# Patient Record
Sex: Male | Born: 1965 | Race: White | Hispanic: No | Marital: Married | Smoking: Never smoker
Health system: Southern US, Community
[De-identification: ages and names within clinical notes are randomized; demographics above are authoritative.]

## PROBLEM LIST (undated history)

## (undated) DIAGNOSIS — B009 Herpesviral infection, unspecified: Secondary | ICD-10-CM

## (undated) DIAGNOSIS — Z8619 Personal history of other infectious and parasitic diseases: Secondary | ICD-10-CM

## (undated) DIAGNOSIS — D649 Anemia, unspecified: Secondary | ICD-10-CM

## (undated) HISTORY — DX: Herpesviral infection, unspecified: B00.9

## (undated) HISTORY — DX: Personal history of other infectious and parasitic diseases: Z86.19

---

## 2004-02-07 DIAGNOSIS — G4700 Insomnia, unspecified: Secondary | ICD-10-CM | POA: Insufficient documentation

## 2004-06-06 DIAGNOSIS — B009 Herpesviral infection, unspecified: Secondary | ICD-10-CM

## 2004-06-06 HISTORY — DX: Herpesviral infection, unspecified: B00.9

## 2014-09-29 ENCOUNTER — Other Ambulatory Visit: Payer: Self-pay

## 2014-09-29 DIAGNOSIS — G4700 Insomnia, unspecified: Secondary | ICD-10-CM

## 2014-09-29 MED ORDER — CLONAZEPAM 0.5 MG PO TABS: ORAL_TABLET | ORAL | Status: DC

## 2014-09-29 NOTE — Telephone Encounter (Signed)
OK to call in #60 rf x 0. He is due for follow up o.v. Within the next month or CPE.

## 2014-09-29 NOTE — Telephone Encounter (Signed)
Patient advised as directed below. [facility] Patient states he will call back to schedule a follow up appointment or CPE next week.

## 2014-09-29 NOTE — Telephone Encounter (Signed)
Patient sent email requesting a refill. Last refill was 04/02/2014, qty: 60 with 4 additional refills. Last office visit was 10/09/2013

## 2014-11-02 ENCOUNTER — Ambulatory Visit (INDEPENDENT_AMBULATORY_CARE_PROVIDER_SITE_OTHER): Payer: 59 | Admitting: Family Medicine

## 2014-11-02 ENCOUNTER — Encounter: Payer: Self-pay | Admitting: Family Medicine

## 2014-11-02 VITALS — BP 122/78 | HR 76 | Temp 98.2°F | Resp 16 | Ht 71.0 in | Wt 233.0 lb

## 2014-11-02 DIAGNOSIS — Z23 Encounter for immunization: Secondary | ICD-10-CM

## 2014-11-02 DIAGNOSIS — Z0000 Encounter for general adult medical examination without abnormal findings: Secondary | ICD-10-CM | POA: Diagnosis not present

## 2014-11-02 DIAGNOSIS — Z125 Encounter for screening for malignant neoplasm of prostate: Secondary | ICD-10-CM | POA: Diagnosis not present

## 2014-11-02 DIAGNOSIS — Z1211 Encounter for screening for malignant neoplasm of colon: Secondary | ICD-10-CM | POA: Diagnosis not present

## 2014-11-02 DIAGNOSIS — M7651 Patellar tendinitis, right knee: Secondary | ICD-10-CM | POA: Insufficient documentation

## 2014-11-02 DIAGNOSIS — F419 Anxiety disorder, unspecified: Secondary | ICD-10-CM | POA: Insufficient documentation

## 2014-11-02 DIAGNOSIS — G4700 Insomnia, unspecified: Secondary | ICD-10-CM | POA: Diagnosis not present

## 2014-11-02 DIAGNOSIS — Z6832 Body mass index (BMI) 32.0-32.9, adult: Secondary | ICD-10-CM | POA: Diagnosis not present

## 2014-11-02 LAB — IFOBT (OCCULT BLOOD): IFOBT: NEGATIVE

## 2014-11-02 MED ORDER — CLONAZEPAM 0.5 MG PO TABS: ORAL_TABLET | ORAL | Status: DC

## 2014-11-02 NOTE — Patient Instructions (Addendum)
   Please contact your eyecare professional to schedule a routine eye exam  

## 2014-11-02 NOTE — Progress Notes (Signed)
Patient: Andrew Clay, Male    DOB: 22-May-1965, 49 y.o.   MRN: [id number] Visit Date: 11/02/2014  Today's Provider: Mila Merry, MD   Chief Complaint  Patient presents with  . Annual Exam  . Insomnia    follow up   Subjective:    Annual physical exam Andrew Clay is a 49 y.o. male who presents today for health maintenance and complete physical. He feels fairly well. He reports exercising doing weight training every day. He reports he is sleeping poorly.  -----------------------------------------------------------------  Insomnia  He presents today for evaluation of insomnia. Onset was 1 years ago. Insomnia is getting worse due to lifestyle changes. Patient states his kids have gone back to school and his wife has to get up at 5am every morning.   He has trouble sleeping once or twice a week.   He does not have difficulty FALLING asleep. He has difficulty STAYING asleep. He does not wake frequently to urinate. He does not have urge to move legs when resting. He is not having pain when trying to sleep  He is having anxiety. Patient states the stress has been tolerable.  He is having a lot of stress in his life. Marland Kitchen He is not having depression.  He is not taking stimulant medications.  He is not taking new medications:  He is not taking OTC sleeping aid. Marland Kitchen He is taking medications to help sleep. Takes Clonazepam as needed at bedtime.  He is not drinking alcohol to help sleep. He is not using illicit drugs.  He states that clonazepam continues to work well to help him get back to sleep when he wakes up at night.   --------------------------------------------------------------------    Review of Systems  Constitutional: Negative for fever, chills, appetite change and fatigue.  HENT: Negative for congestion, ear pain, hearing loss, nosebleeds and trouble swallowing.   Eyes: Negative for pain and visual disturbance.  Respiratory: Negative for cough, chest  tightness and shortness of breath.   Cardiovascular: Negative for chest pain, palpitations and leg swelling.  Gastrointestinal: Negative for nausea, vomiting, abdominal pain, diarrhea, constipation and blood in stool.  Endocrine: Negative for polydipsia, polyphagia and polyuria.  Genitourinary: Positive for dysuria. Negative for flank pain.  Musculoskeletal: Negative for myalgias, back pain, joint swelling, arthralgias and neck stiffness.  Skin: Negative for color change, rash and wound.  Neurological: Negative for dizziness, tremors, seizures, speech difficulty, weakness, light-headedness and headaches.  Psychiatric/Behavioral: Negative for behavioral problems, confusion, sleep disturbance, dysphoric mood and decreased concentration. The patient is not nervous/anxious.   All other systems reviewed and are negative.   Social History He  reports that he has never smoked. He does not have any smokeless tobacco history on file. He reports that he drinks alcohol. He reports that he does not use illicit drugs. Social History   Social History  . Marital Status: Married    Spouse Name: N/A  . Number of Children: 5  . Years of Education: N/A   Occupational History  . Architectural technologist    Social History Main Topics  . Smoking status: Never Smoker   . Smokeless tobacco: None  . Alcohol Use: 0.0 oz/week    0 Standard drinks or equivalent per week     Comment: drinks 2 glasses of wine a night  . Drug Use: No  . Sexual Activity: Not Asked   Other Topics Concern  . None   Social History Narrative    Patient Active  Problem List   Diagnosis Date Noted  . Anxiety 11/02/2014  . Patellar tendinitis of right knee 11/02/2014  . Uncomplicated herpes simplex 06/06/2004  . Insomnia 02/07/2004    History reviewed. No pertinent past surgical history.  Family History  Family Status  Relation Status Death Age  . Mother Alive   . Father Alive   . Maternal Grandmother Deceased     old age     His family history includes Diabetes in his father; Stroke in his mother. He was adopted.    No Known Allergies  Previous Medications   CLONAZEPAM (KLONOPIN) 0.5 MG TABLET    Up to 2 tablets at bedtime, as needed    Patient Care Team: Malva Limes, MD as PCP - General (Family Medicine)     Objective:   Vitals: BP 122/78 mmHg  Pulse 76  Temp(Src) 98.2 F (36.8 C) (Oral)  Resp 16  Ht  (1.803 m)  Wt 233 lb (105.688 kg)  BMI 32.51 kg/m2   Physical Exam   General Appearance:    Alert, cooperative, no distress, appears stated age  Head:    Normocephalic, without obvious abnormality, atraumatic  Eyes:    PERRL, conjunctiva/corneas clear, EOM's intact, fundi    benign, both eyes       Ears:    Normal TM's and external ear canals, both ears  Nose:   Nares normal, septum midline, mucosa normal, no drainage   or sinus tenderness  Throat:   Lips, mucosa, and tongue normal; teeth and gums normal  Neck:   Supple, symmetrical, trachea midline, no adenopathy;       thyroid:  No enlargement/tenderness/nodules; no carotid   bruit or JVD  Back:     Symmetric, no curvature, ROM normal, no CVA tenderness  Lungs:     Clear to auscultation bilaterally, respirations unlabored  Chest wall:    No tenderness or deformity  Heart:    Regular rate and rhythm, S1 and S2 normal, no murmur, rub   or gallop  Abdomen:     Soft, non-tender, bowel sounds active all four quadrants,    no masses, no organomegaly  Genitalia:    deferred  Rectal:    normal tone, normal prostate, no masses or tenderness and the prostate is enlarged at the bilateral, with an approx volume of 20 gms, negative bulge  Extremities:   Extremities normal, atraumatic, no cyanosis or edema  Pulses:   2+ and symmetric all extremities  Skin:   Skin color, texture, turgor normal, no rashes or lesions  Lymph nodes:   Cervical, supraclavicular, and axillary nodes normal  Neurologic:   CNII-XII intact. Normal strength,  sensation and reflexes      throughout    Depression Screen PHQ 2/9 Scores 11/02/2014  PHQ - 2 Score 0  PHQ- 9 Score 4    Results for orders placed or performed in visit on 11/02/14  IFOBT POC (occult bld, rslt in office)  Result Value Ref Range   IFOBT Negative    EKG: NSR  Assessment & Plan:     Routine Health Maintenance and Physical Exam  Exercise Activities and Dietary recommendations Goals    None       There is no immunization history on file for this patient.  Health Maintenance  Topic Date Due  . Hepatitis C Screening  06-29-1965  . HIV Screening  12/18/1978  . TETANUS/TDAP  12/18/1982  . COLONOSCOPY  12/17/2013  . INFLUENZA VACCINE  09/07/2014  Discussed health benefits of physical activity, and encouraged him to engage in regular exercise appropriate for his age and condition.    --------------------------------------------------------------------  1. Annual physical exam Doing well. Advised he needs routine eye exam - EKG 12-Lead - Lipid panel - Basic metabolic panel - PSA  2. Insomnia Doing well with clonazepam hs.  - clonazePAM (KLONOPIN) 0.5 MG tablet; Up to 2 tablets at bedtime, as needed  Dispense: 60 tablet; Refill: 4  3. BMI 32.0-32.9,adult D&E  4. Colon cancer screening  - Ambulatory referral to Gastroenterology - IFOBT POC (occult bld, rslt in office)  5. Prostate cancer screening   6. Need for Tdap vaccination  - Tdap vaccine greater than or equal to 7yo IM  7. Need for influenza vaccination  - Flu Vaccine QUAD 36+ mos IM

## 2014-11-03 ENCOUNTER — Telehealth: Payer: Self-pay | Admitting: *Deleted

## 2014-11-03 NOTE — Telephone Encounter (Signed)
error 

## 2014-11-10 LAB — PSA: Prostate Specific Ag, Serum: 0.8 ng/mL (ref 0.0–4.0)

## 2014-11-10 LAB — BASIC METABOLIC PANEL
BUN/Creatinine Ratio: 16 (ref 9–20)
BUN: 15 mg/dL (ref 6–24)
CHLORIDE: 105 mmol/L (ref 97–108)
CO2: 26 mmol/L (ref 18–29)
Calcium: 9.4 mg/dL (ref 8.7–10.2)
Creatinine, Ser: 0.91 mg/dL (ref 0.76–1.27)
GFR, EST AFRICAN AMERICAN: 113 mL/min/{1.73_m2} (ref >59)
GFR, EST NON AFRICAN AMERICAN: 98 mL/min/{1.73_m2} (ref >59)
Glucose: 98 mg/dL (ref 65–99)
POTASSIUM: 4.7 mmol/L (ref 3.5–5.2)
SODIUM: 143 mmol/L (ref 134–144)

## 2014-11-10 LAB — LIPID PANEL
CHOL/HDL RATIO: 4.1 ratio (ref 0.0–5.0)
CHOLESTEROL TOTAL: 135 mg/dL (ref 100–199)
HDL: 33 mg/dL — ABNORMAL LOW (ref >39)
LDL CALC: 88 mg/dL (ref 0–99)
Triglycerides: 70 mg/dL (ref 0–149)
VLDL CHOLESTEROL CAL: 14 mg/dL (ref 5–40)

## 2015-04-23 ENCOUNTER — Other Ambulatory Visit: Payer: Self-pay | Admitting: *Deleted

## 2015-04-23 DIAGNOSIS — G4700 Insomnia, unspecified: Secondary | ICD-10-CM

## 2015-04-23 MED ORDER — CLONAZEPAM 0.5 MG PO TABS: ORAL_TABLET | ORAL | Status: DC

## 2015-04-23 NOTE — Telephone Encounter (Signed)
Rx called in to pharmacy. 

## 2015-04-23 NOTE — Telephone Encounter (Signed)
Please call in clonazepam  

## 2015-09-16 ENCOUNTER — Other Ambulatory Visit: Payer: Self-pay | Admitting: Family Medicine

## 2015-09-16 DIAGNOSIS — G4700 Insomnia, unspecified: Secondary | ICD-10-CM

## 2015-09-16 NOTE — Telephone Encounter (Signed)
Please call in clonazepam  

## 2015-09-16 NOTE — Telephone Encounter (Signed)
Rx called in to pharmacy. 

## 2015-10-19 ENCOUNTER — Other Ambulatory Visit: Payer: Self-pay | Admitting: Family Medicine

## 2015-10-19 DIAGNOSIS — G4700 Insomnia, unspecified: Secondary | ICD-10-CM

## 2015-10-19 NOTE — Telephone Encounter (Signed)
Please call in clonazepam  Patient has not been seen for a year. Needs follow o.v. Or CPE in the next month.

## 2015-10-19 NOTE — Telephone Encounter (Signed)
Patient stated that he will call back and schedule an appt.

## 2015-10-19 NOTE — Telephone Encounter (Signed)
Rx called in to pharmacy. 

## 2015-11-22 ENCOUNTER — Other Ambulatory Visit: Payer: Self-pay | Admitting: Family Medicine

## 2015-11-22 MED ORDER — CLONAZEPAM 0.5 MG PO TABS: ORAL_TABLET | ORAL | 3 refills | Status: DC

## 2015-11-22 NOTE — Telephone Encounter (Signed)
Rx called in to pharmacy. 

## 2015-11-22 NOTE — Telephone Encounter (Signed)
Please call in clonazepam  

## 2015-11-22 NOTE — Telephone Encounter (Signed)
Pt contacted office for refill request on the following medications:  clonazePAM (KLONOPIN) 0.5 MG tablet.  [facility] [address]  [place]  ZO#[phone]/WJCB#[phone]/MW  Pt had an appointment scheduled for 12/06/2015.  Pt states he will be out of medication today.

## 2015-12-06 ENCOUNTER — Ambulatory Visit
Admission: RE | Admit: 2015-12-06 | Discharge: 2015-12-06 | Disposition: A | Discharge status: Home / Self Care | Payer: 59 | Source: Ambulatory Visit | Attending: Family Medicine | Admitting: Family Medicine

## 2015-12-06 ENCOUNTER — Encounter: Payer: Self-pay | Admitting: Family Medicine

## 2015-12-06 ENCOUNTER — Ambulatory Visit (INDEPENDENT_AMBULATORY_CARE_PROVIDER_SITE_OTHER): Payer: 59 | Admitting: Family Medicine

## 2015-12-06 VITALS — BP 130/90 | HR 68 | Temp 98.6°F | Resp 16 | Wt 234.0 lb

## 2015-12-06 DIAGNOSIS — M25562 Pain in left knee: Principal | ICD-10-CM

## 2015-12-06 DIAGNOSIS — M25561 Pain in right knee: Secondary | ICD-10-CM

## 2015-12-06 DIAGNOSIS — M25762 Osteophyte, left knee: Secondary | ICD-10-CM | POA: Insufficient documentation

## 2015-12-06 DIAGNOSIS — F5101 Primary insomnia: Secondary | ICD-10-CM | POA: Diagnosis not present

## 2015-12-06 DIAGNOSIS — R21 Rash and other nonspecific skin eruption: Secondary | ICD-10-CM

## 2015-12-06 NOTE — Progress Notes (Signed)
Patient: Andrew Clay Male    DOB: Jun 01, 1965   50 y.o.   MRN: [id number] Visit Date: 12/06/2015  Today's Provider: Lelon Huh, MD   Chief Complaint  Patient presents with  . Knee Pain    x several months   Subjective:    Knee Pain   Incident onset: several months ago. There was no injury mechanism. The pain is present in the left knee and right knee. The quality of the pain is described as aching. The pain has been intermittent since onset. Exacerbated by: running or bending down. He has tried ice (stretching) for the symptoms. The treatment provided no relief.  He states pain starting after being bit be medium sized tick about 3 months ago, and was followed by red rash down left lateral hip and leg. Rash eventually resolved, but pain in knee have persistent and worsened. Has had no fever or generalized rash, no other joint pains. Pain is present every day as soon as he gets out of bed, and gradually worsens throughout the day.   He is also here for follow up of clonazepam which he takes periodically to help rest at night, especially when under more stress than usual. States it continues to work well with no after effects day after he takes it.     No Known Allergies   Current Outpatient Prescriptions:  .  clonazePAM (KLONOPIN) 0.5 MG tablet, TAKE UP TO 2 TABLETS BY MOUTH AT BEDTIME AS NEEDED, Disp: 60 tablet, Rfl: 3  Review of Systems  Constitutional: Negative for appetite change, chills and fever.  Respiratory: Negative for chest tightness, shortness of breath and wheezing.   Cardiovascular: Negative for chest pain and palpitations.  Gastrointestinal: Negative for abdominal pain, nausea and vomiting.  Musculoskeletal: Positive for arthralgias (both knees).    Social History  Substance Use Topics  . Smoking status: Never Smoker  . Smokeless tobacco: Never Used  . Alcohol use 0.0 oz/week     Comment: drinks 2 glasses of wine a night   Objective:   BP 130/90 (BP  Location: Left Arm, Patient Position: Sitting, Cuff Size: Large)   Pulse 68   Temp 98.6 F (37 C) (Oral)   Resp 16   Wt 234 lb (106.1 kg)   SpO2 97% Comment: room air  BMI 32.64 kg/m   Physical Exam  General appearance: alert, well developed, well nourished, cooperative and in no distress Head: Normocephalic, without obvious abnormality, atraumatic Respiratory: Respirations even and unlabored, normal respiratory rate Extremities: Tender around joint lines of both knees R>L. Minimal swelling. No erythema. FROM of both knees. No other joint abnormalities      Assessment & Plan:     1. Pain in both knees, unspecified chronicity  - DG Knee Complete 4 Views Right; Future - DG Knee Complete 4 Views Left; Future  2. Primary insomnia Doing well with prn clonazepam which was refilled today.   3. Arthralgia of both knees - B. Burgdorfi Antibodies - Sed Rate (ESR) - CBC with Differential/Platelet  4. Rash No resolved, but he feels it may have been related to tick bite and persistent join pains.  - B. Burgdorfi Antibodies - Sed Rate (ESR) - CBC with Differential/Platelet       Lelon Huh, MD  [facility]

## 2015-12-07 LAB — CBC WITH DIFFERENTIAL/PLATELET
BASOS ABS: 0 10*3/uL (ref 0.0–0.2)
Basos: 0 %
EOS (ABSOLUTE): 0.1 10*3/uL (ref 0.0–0.4)
Eos: 1 %
Hematocrit: 43.9 % (ref 37.5–51.0)
Hemoglobin: 15.1 g/dL (ref 12.6–17.7)
IMMATURE GRANS (ABS): 0 10*3/uL (ref 0.0–0.1)
IMMATURE GRANULOCYTES: 0 %
LYMPHS: 32 %
Lymphocytes Absolute: 2.2 10*3/uL (ref 0.7–3.1)
MCH: 30.8 pg (ref 26.6–33.0)
MCHC: 34.4 g/dL (ref 31.5–35.7)
MCV: 89 fL (ref 79–97)
Monocytes Absolute: 0.6 10*3/uL (ref 0.1–0.9)
Monocytes: 8 %
NEUTROS PCT: 59 %
Neutrophils Absolute: 4 10*3/uL (ref 1.4–7.0)
PLATELETS: 226 10*3/uL (ref 150–379)
RBC: 4.91 x10E6/uL (ref 4.14–5.80)
RDW: 13.3 % (ref 12.3–15.4)
WBC: 6.9 10*3/uL (ref 3.4–10.8)

## 2015-12-07 LAB — B. BURGDORFI ANTIBODIES: Lyme IgG/IgM Ab: <0.91 {ISR} (ref 0.00–0.90)

## 2015-12-07 LAB — SEDIMENTATION RATE: SED RATE: 4 mm/h (ref 0–30)

## 2015-12-09 ENCOUNTER — Telehealth: Payer: Self-pay

## 2015-12-09 MED ORDER — NAPROXEN 500 MG PO TABS: 500.0000 mg | ORAL_TABLET | Freq: Two times a day (BID) | ORAL | 1 refills | Status: DC | PRN

## 2015-12-09 NOTE — Telephone Encounter (Signed)
-----   Message from Malva Limesonald E Fisher, MD sent at 12/08/2015  9:55 AM EDT ----- Labs are completely normal. No sign of any tick born infections. Xrays of knees show moderate arthritis with some spurs in knee join. Recommend try of naprosyn 500mg  Bid prn, #60 rf x 1. If no improvement with naprosyn then may want to see orthopedist for cortisone injections.

## 2015-12-09 NOTE — Telephone Encounter (Signed)
Patient was advised of report, [facility] [place] see order. KW

## 2015-12-09 NOTE — Telephone Encounter (Signed)
[id number] 

## 2016-03-03 ENCOUNTER — Other Ambulatory Visit: Payer: Self-pay | Admitting: *Deleted

## 2016-03-03 MED ORDER — CLONAZEPAM 0.5 MG PO TABS: ORAL_TABLET | ORAL | 1 refills | Status: DC

## 2016-03-03 NOTE — Telephone Encounter (Signed)
[facility] for [facility] substance it needs to be faxed in or mailed in-aa

## 2016-03-03 NOTE — Telephone Encounter (Signed)
Please call in clonazepam  

## 2016-03-08 MED ORDER — CLONAZEPAM 0.5 MG PO TABS: ORAL_TABLET | ORAL | 1 refills | Status: DC

## 2016-05-24 ENCOUNTER — Other Ambulatory Visit: Payer: Self-pay | Admitting: Family Medicine

## 2016-05-24 MED ORDER — CLONAZEPAM 0.5 MG PO TABS: ORAL_TABLET | ORAL | 1 refills | Status: DC

## 2016-05-24 NOTE — Telephone Encounter (Signed)
Please call in clonazepam  

## 2016-05-24 NOTE — Telephone Encounter (Signed)
Pt needs a 7 day supply of   clonazePAM (KLONOPIN) 0.5 MG tablet [facility] [address] [facility] [place]  [facility] is sending his rx but it will get to him late after he would run out.  Pt's call back is [phone]  Thanks Barth Kirks

## 2016-05-25 MED ORDER — CLONAZEPAM 0.5 MG PO TABS: ORAL_TABLET | ORAL | 1 refills | Status: DC

## 2016-05-25 NOTE — Telephone Encounter (Signed)
Rx faxed to pharmacy  

## 2016-11-27 DIAGNOSIS — Z23 Encounter for immunization: Secondary | ICD-10-CM | POA: Diagnosis not present

## 2016-12-20 ENCOUNTER — Other Ambulatory Visit: Payer: Self-pay | Admitting: Family Medicine

## 2016-12-20 NOTE — Telephone Encounter (Signed)
Last OV 12/06/15 and last RF 05/25/16

## 2016-12-20 NOTE — Telephone Encounter (Signed)
Patient has not been seen in over a year, needs to schedule o.v. Before refill can be approved.  

## 2016-12-20 NOTE — Telephone Encounter (Signed)
[facility] pharmacy faxed a request for a 90-days supply for the following medication. Thanks CC  clonazePAM (KLONOPIN) 0.5 MG tablet

## 2016-12-21 MED ORDER — CLONAZEPAM 0.5 MG PO TABS: ORAL_TABLET | ORAL | 0 refills | Status: DC

## 2016-12-21 NOTE — Telephone Encounter (Signed)
Patient states that he is going on vacation next week and wanted to know if he can have enough clonazepam to get by on until he can come in for ov. Pt would not schedule appt.

## 2016-12-21 NOTE — Telephone Encounter (Signed)
Please call in clonazepam  

## 2016-12-22 MED ORDER — CLONAZEPAM 0.5 MG PO TABS: ORAL_TABLET | ORAL | 0 refills | Status: DC

## 2016-12-22 NOTE — Telephone Encounter (Signed)
Spoke with [facility] pharmacist and they can not take a verbal call in on controlled medications per regulations. RX faxed to them [place]-Anastasiya V Hopkins, RMA

## 2016-12-22 NOTE — Telephone Encounter (Signed)
[facility] but is going to contact [facility] to find out if they can do a 30 day supply or if it has to go to local pharmacy. Please advise. Thanks TNP

## 2016-12-22 NOTE — Telephone Encounter (Signed)
Pt called after speaking with [facility]. Pt stated to send to [facility] they will fill a 30 day supply. Thanks TNP

## 2016-12-22 NOTE — Telephone Encounter (Signed)
Patient will call us back and let us know per Rosey Batheresa

## 2016-12-22 NOTE — Telephone Encounter (Signed)
Lmtcb, want to verify what pharmacy he wants medication called in to since its only 30 tablets-Andrew Clay, RMA

## 2017-01-08 ENCOUNTER — Ambulatory Visit (INDEPENDENT_AMBULATORY_CARE_PROVIDER_SITE_OTHER): Payer: 59 | Admitting: Family Medicine

## 2017-01-08 ENCOUNTER — Encounter: Payer: Self-pay | Admitting: Family Medicine

## 2017-01-08 ENCOUNTER — Telehealth: Payer: Self-pay | Admitting: Family Medicine

## 2017-01-08 VITALS — BP 120/80 | HR 69 | Temp 98.0°F | Resp 16 | Wt 236.0 lb

## 2017-01-08 DIAGNOSIS — Z1211 Encounter for screening for malignant neoplasm of colon: Secondary | ICD-10-CM | POA: Diagnosis not present

## 2017-01-08 DIAGNOSIS — F5101 Primary insomnia: Secondary | ICD-10-CM

## 2017-01-08 MED ORDER — CLONAZEPAM 0.5 MG PO TABS: ORAL_TABLET | ORAL | 1 refills | Status: DC

## 2017-01-08 NOTE — Progress Notes (Signed)
       Patient: Andrew Clay Male    DOB: December 09, 1965   51 y.o.   MRN: [id number] Visit Date: 01/08/2017  Today's Provider: Mila Merryonald Fisher, MD   Chief Complaint  Patient presents with  . Follow-up  . Insomnia   Subjective:    HPI  Insomnia From 12/06/2015-He has reduced clonazepam to 1 x 0.5mg  hs which is effective, but he would like to wean off off medication. He has a very hard time sleeping if he runs out. Has tried melatonin in the past. He does drink one glass of wine most nights. No other problems with mood or anxiety.   Colon cancer screening Has been doing iFOBT in the past. No family history of colon cancer or polyps.   No Known Allergies   Current Outpatient Medications:  .  clonazePAM (KLONOPIN) 0.5 MG tablet, TAKE UP TO 2 TABLETS BY MOUTH AT BEDTIME AS NEEDED, Disp: 30 tablet, Rfl: 0 .  naproxen (NAPROSYN) 500 MG tablet, Take 1 tablet (500 mg total) by mouth 2 (two) times daily as needed for moderate pain. (Patient not taking: Reported on 01/08/2017), Disp: 60 tablet, Rfl: 1  Review of Systems  Constitutional: Negative for appetite change, chills and fever.  Respiratory: Negative for chest tightness, shortness of breath and wheezing.   Cardiovascular: Negative for chest pain and palpitations.  Gastrointestinal: Negative for abdominal pain, nausea and vomiting.    Social History   Tobacco Use  . Smoking status: Never Smoker  . Smokeless tobacco: Never Used  Substance Use Topics  . Alcohol use: Yes    Alcohol/week: 0.0 oz    Comment: drinks 2 glasses of wine a night   Objective:   BP 120/80 (BP Location: Right Arm, Patient Position: Sitting, Cuff Size: Large)   Pulse 69   Temp 98 F (36.7 C) (Oral)   Resp 16   Wt 236 lb (107 kg)   SpO2 98%   BMI 32.92 kg/m  Vitals:   01/08/17 0902  BP: 120/80  Pulse: 69  Resp: 16  Temp: 98 F (36.7 C)  TempSrc: Oral  SpO2: 98%  Weight: 236 lb (107 kg)     Physical Exam  General appearance: alert, well  developed, well nourished, cooperative and in no distress Head: Normocephalic, without obvious abnormality, atraumatic Respiratory: Respirations even and unlabored, normal respiratory rate Extremities: No gross deformities Skin: Skin color, texture, turgor normal. No rashes seen  Psych: Appropriate mood and affect. Neurologic: Mental status: Alert, oriented to person, place, and time, thought content appropriate.     Assessment & Plan:     1. Primary insomnia Discussed good sleep hygiene including abstaining from alcohol and stimulants. Reduced clonazepam to 1/2 tablet hs as tolerated, try QOD if doing well after 1-2 months.   2. Colon cancer screening  - Cologuard       Mila Merryonald Fisher, MD  [facility]

## 2017-01-08 NOTE — Telephone Encounter (Signed)
Order for cologuard faxed to [facility] °

## 2017-01-08 NOTE — Patient Instructions (Signed)

## 2017-11-05 ENCOUNTER — Other Ambulatory Visit: Payer: Self-pay | Admitting: Family Medicine

## 2017-11-05 MED ORDER — CLONAZEPAM 0.5 MG PO TABS: ORAL_TABLET | ORAL | 1 refills | Status: DC

## 2017-11-05 NOTE — Telephone Encounter (Signed)
Pt needing a refill on: clonazePAM (KLONOPIN) 0.5 MG tablet  Please fill at: [facility] [place], [place] - [address]  Thanks, TGH

## 2017-11-21 DIAGNOSIS — Z23 Encounter for immunization: Secondary | ICD-10-CM | POA: Diagnosis not present

## 2018-05-21 ENCOUNTER — Other Ambulatory Visit: Payer: Self-pay | Admitting: Family Medicine

## 2018-05-21 MED ORDER — CLONAZEPAM 0.5 MG PO TABS: ORAL_TABLET | ORAL | 2 refills | Status: DC

## 2018-05-21 NOTE — Telephone Encounter (Signed)
Pt needs refill on   Clonazepam 0.5 mg  [facility] [address]  Thanks teri

## 2018-10-02 IMAGING — CR DG KNEE COMPLETE 4+V*L*
1 series · 4 of 4 positions shown · non-contrast
Comparison: None in PACs

CLINICAL DATA: Bilateral knee pain greatest on the right without
discrete injury

EXAM:
RIGHT KNEE - COMPLETE 4+ VIEW; LEFT KNEE - COMPLETE 4+ VIEW

[Series 1: dg knee complete 4 views left · 0.14mm/px · 4 of 4 slices shown]
[im 1/4]
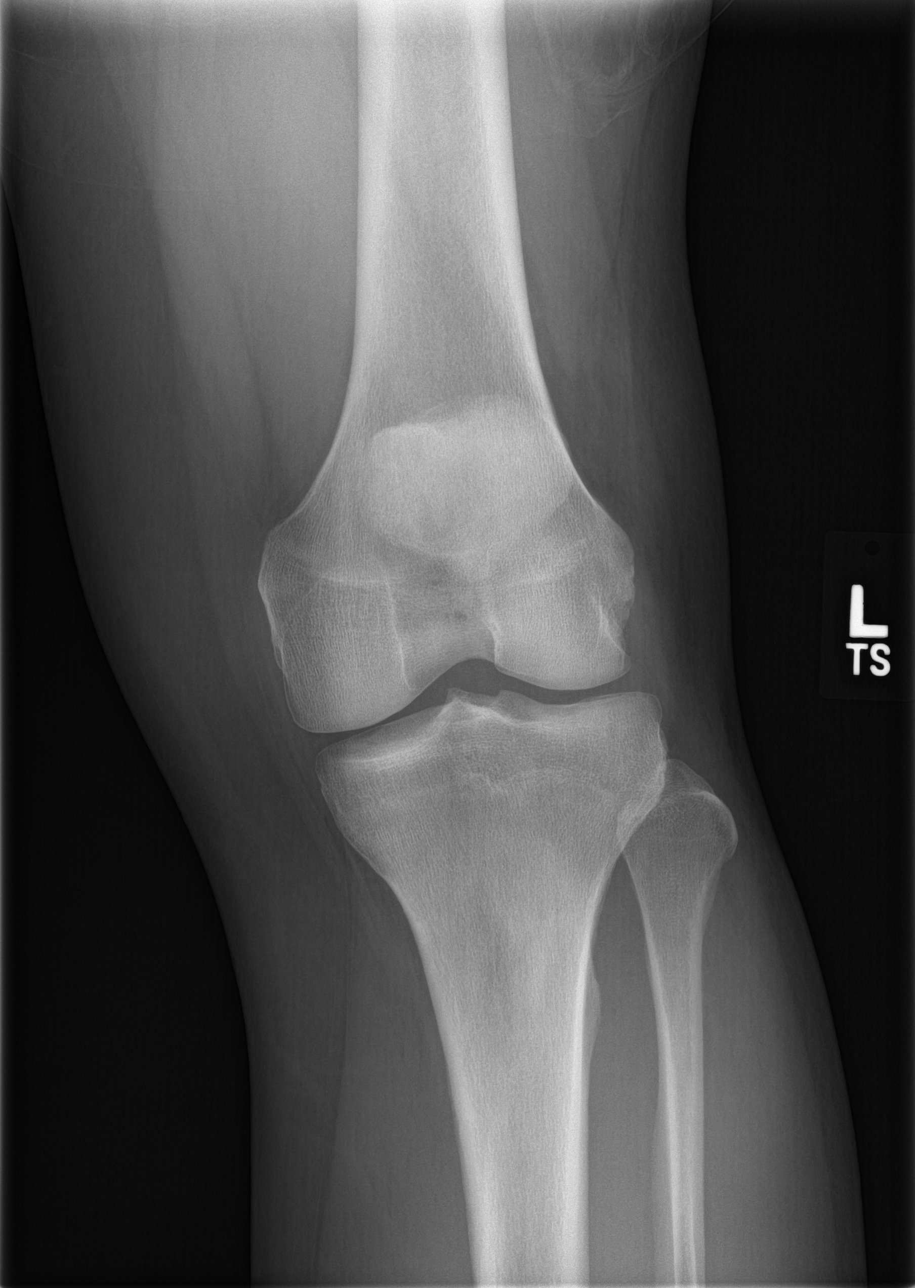
[im 2/4]
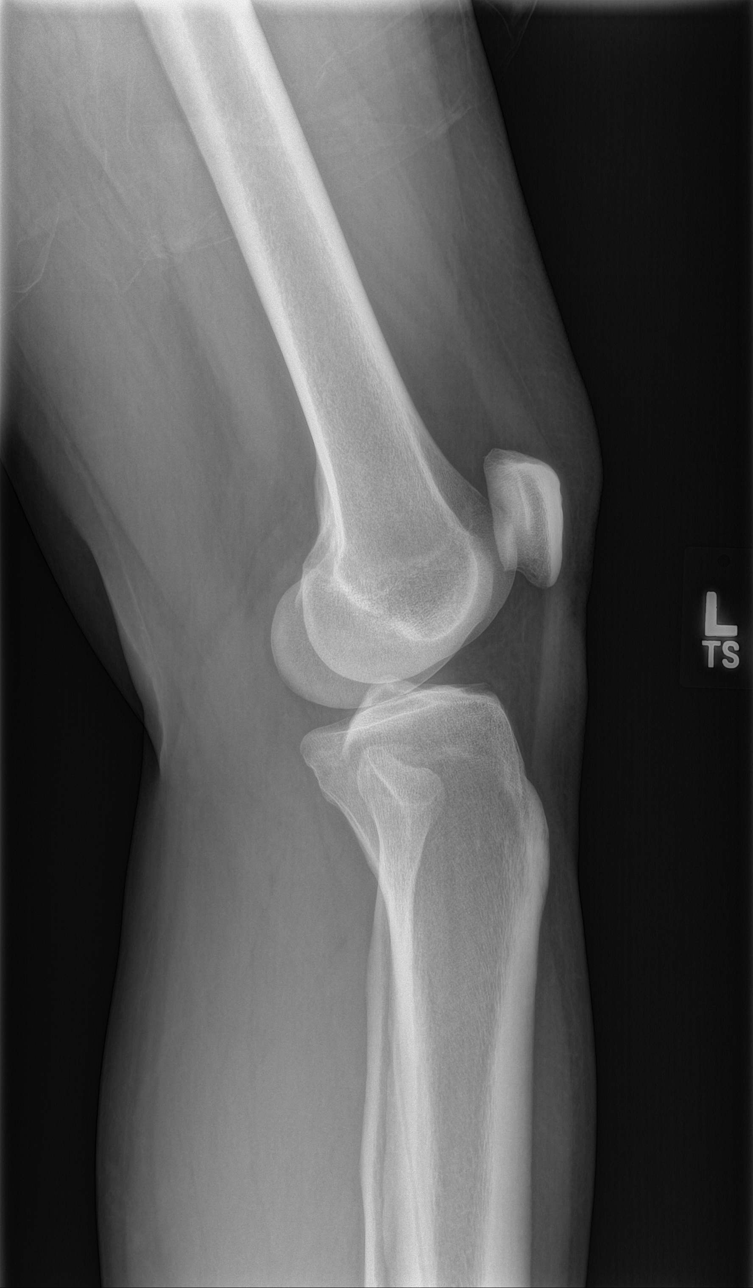
[im 3/4]
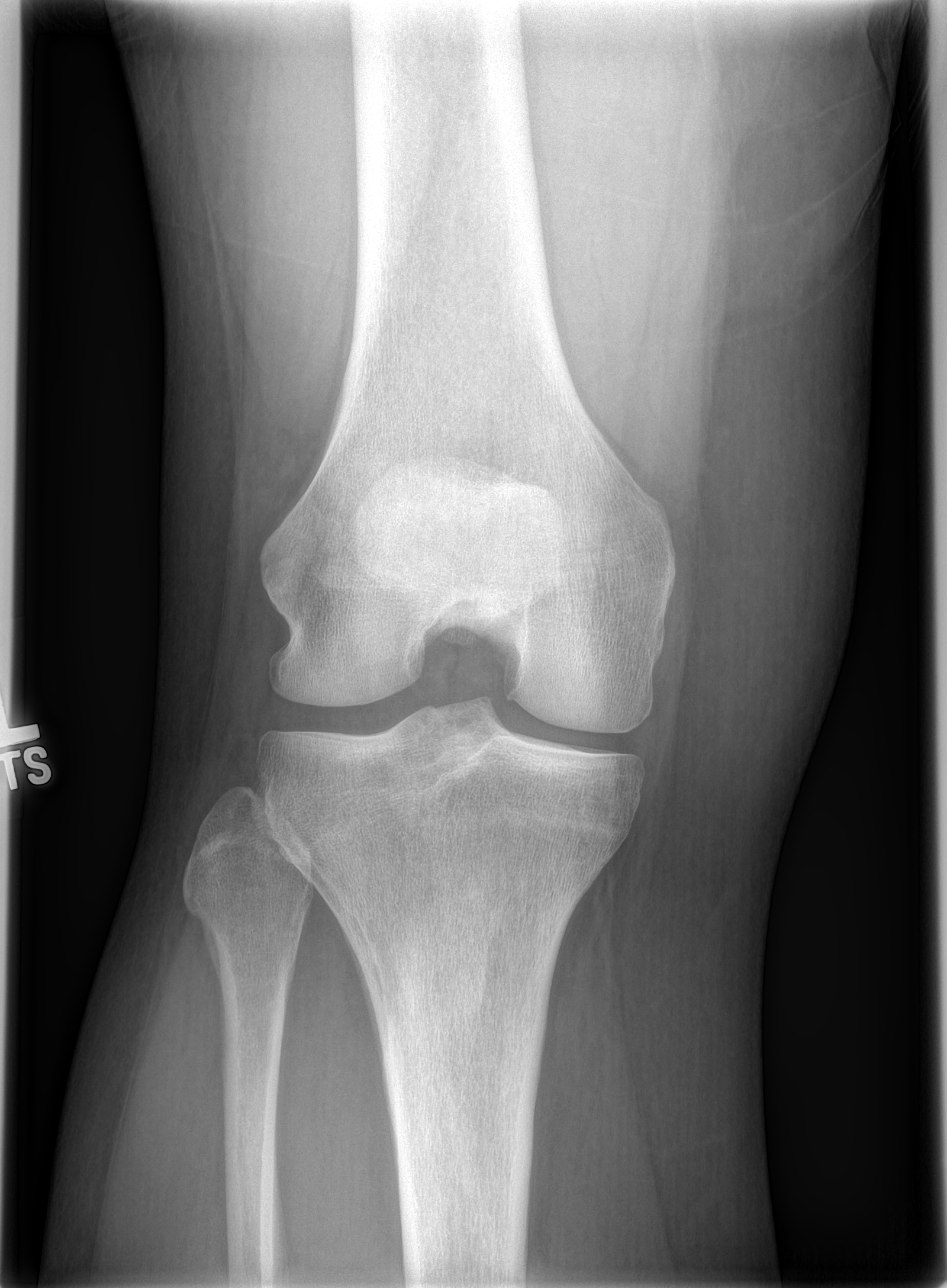
[im 4/4]
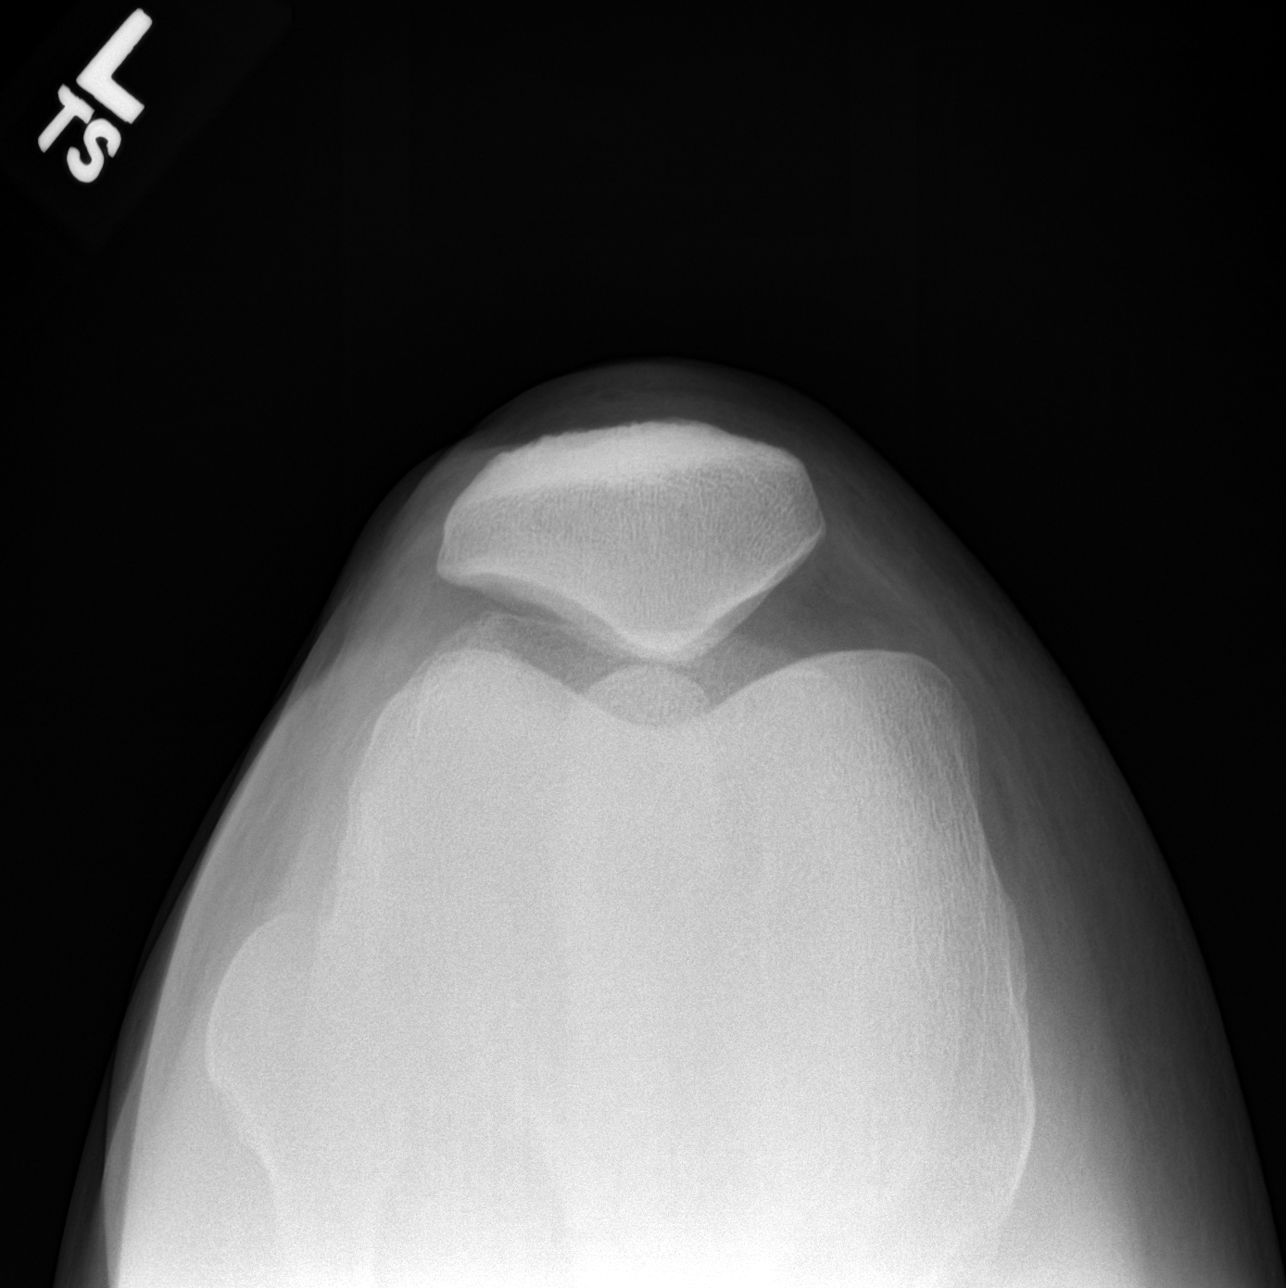

[4 of 4 positions shown; findings below may reference images not displayed]

FINDINGS: Right knee: The bones are subjectively adequately mineralized. There
is mild narrowing of the medial joint compartment. The lateral and
patellofemoral compartments appear normal. There is mild beaking of
the tibial spine spines. There is no significant joint effusion.
There is no fracture or dislocation.

Left knee: The bones are subjectively adequately mineralized. There
is mild narrowing of the medial joint compartment. The lateral and
patellofemoral joint compartments appear normal. There is beaking of
the medial tibial spine. The patella exhibits tiny osteophytes
arising from the superior and inferior articular margins but is
otherwise appears normal. There is no joint effusion or acute
fracture.
IMPRESSION: Mild degenerative narrowing of the medial joint compartments
bilaterally. Small osteophytes involving the articular margins of
the left patella consistent with osteoarthritis.

## 2018-12-23 ENCOUNTER — Other Ambulatory Visit: Payer: Self-pay | Admitting: Family Medicine

## 2021-08-16 NOTE — Progress Notes (Unsigned)
     I,Roshena L Chambers,acting as a scribe for Mila Merry, MD.,have documented all relevant documentation on the behalf of Mila Merry, MD,as directed by  Mila Merry, MD while in the presence of Mila Merry, MD.    Established patient visit   Patient: Andrew Clay   DOB: 1965-07-10   56 y.o. Male  MRN: [id number] Visit Date: 08/17/2021  Today's healthcare provider: Mila Merry, MD   Chief Complaint  Patient presents with   Eye Drainage   Subjective    Conjunctivitis  Episode onset: 5 days ago. Associated symptoms include eye itching, headaches, eye discharge, eye pain and eye redness. Pertinent negatives include no fever, no abdominal pain, no nausea, no vomiting and no wheezing.   His son had pink eye last week and is getting better being treated with eye drops. Patient has had some sinus congestion for a few day, however he states his eye is less red today. Is not painful and has no photophobia.   Medications: Outpatient Medications Prior to Visit  Medication Sig   [DISCONTINUED] clonazePAM (KLONOPIN) 0.5 MG tablet TAKE 1/2 TO 1 TABLET BY MOUTH AT BEDTIME AS NEEDED   No facility-administered medications prior to visit.    Review of Systems  Constitutional:  Negative for appetite change, chills and fever.  Eyes:  Positive for pain, discharge, redness and itching.  Respiratory:  Negative for chest tightness, shortness of breath and wheezing.   Cardiovascular:  Negative for chest pain and palpitations.  Gastrointestinal:  Negative for abdominal pain, nausea and vomiting.  Neurological:  Positive for headaches.       Objective    BP 132/89 (BP Location: Right Arm, Patient Position: Sitting, Cuff Size: Large)   Pulse 68   Temp 98 F (36.7 C) (Oral)   Resp 14   Ht 6' (1.829 m)   Wt 214 lb (97.1 kg)   SpO2 100% Comment: room air  BMI 29.02 kg/m    Physical Exam  Right eye mildly injected. No discharge. EOMIs.    Assessment & Plan     1. Acute viral  conjunctivitis of right eye Seems to be resolving. Counseled that this usually requires no treatment, but occasionally secondary bacterial infections can develop. Given printed prescription for ciprofloxacin (CILOXAN) 0.3 % ophthalmic solution; Place 1 drop into the right eye every 4 (four) hours while awake for 7 days.  Dispense: 5 mL which he is to fill only if he develops s/s secondary infection.         The entirety of the information documented in the History of Present Illness, Review of Systems and Physical Exam were personally obtained by me. Portions of this information were initially documented by the CMA and reviewed by me for thoroughness and accuracy.     Mila Merry, MD  [facility] [phone] (phone) [phone] (fax)  [facility]

## 2021-08-17 ENCOUNTER — Ambulatory Visit (INDEPENDENT_AMBULATORY_CARE_PROVIDER_SITE_OTHER): Payer: 59 | Admitting: Family Medicine

## 2021-08-17 ENCOUNTER — Encounter: Payer: Self-pay | Admitting: Family Medicine

## 2021-08-17 VITALS — BP 132/89 | HR 68 | Temp 98.0°F | Resp 14 | Ht 72.0 in | Wt 214.0 lb

## 2021-08-17 DIAGNOSIS — B309 Viral conjunctivitis, unspecified: Secondary | ICD-10-CM | POA: Diagnosis not present

## 2021-08-17 MED ORDER — CIPROFLOXACIN HCL 0.3 % OP SOLN: 1.0000 [drp] | OPHTHALMIC | 0 refills | Status: AC

## 2023-11-06 ENCOUNTER — Emergency Department

## 2023-11-06 ENCOUNTER — Other Ambulatory Visit: Payer: Self-pay

## 2023-11-06 ENCOUNTER — Inpatient Hospital Stay
Admission: EM | Admit: 2023-11-06 | Discharge: 2023-11-16 | DRG: 854 | Disposition: A | Discharge status: Home Health | Attending: Student | Admitting: Student

## 2023-11-06 DIAGNOSIS — D62 Acute posthemorrhagic anemia: Secondary | ICD-10-CM | POA: Diagnosis present

## 2023-11-06 DIAGNOSIS — A4101 Sepsis due to Methicillin susceptible Staphylococcus aureus: Secondary | ICD-10-CM | POA: Diagnosis present

## 2023-11-06 DIAGNOSIS — S71102A Unspecified open wound, left thigh, initial encounter: Secondary | ICD-10-CM | POA: Diagnosis present

## 2023-11-06 DIAGNOSIS — B9561 Methicillin susceptible Staphylococcus aureus infection as the cause of diseases classified elsewhere: Secondary | ICD-10-CM | POA: Diagnosis not present

## 2023-11-06 DIAGNOSIS — E559 Vitamin D deficiency, unspecified: Secondary | ICD-10-CM | POA: Diagnosis present

## 2023-11-06 DIAGNOSIS — N182 Chronic kidney disease, stage 2 (mild): Secondary | ICD-10-CM | POA: Diagnosis present

## 2023-11-06 DIAGNOSIS — E871 Hypo-osmolality and hyponatremia: Secondary | ICD-10-CM | POA: Diagnosis present

## 2023-11-06 DIAGNOSIS — L03116 Cellulitis of left lower limb: Secondary | ICD-10-CM | POA: Diagnosis present

## 2023-11-06 DIAGNOSIS — Z833 Family history of diabetes mellitus: Secondary | ICD-10-CM | POA: Diagnosis not present

## 2023-11-06 DIAGNOSIS — D75838 Other thrombocytosis: Secondary | ICD-10-CM | POA: Diagnosis not present

## 2023-11-06 DIAGNOSIS — N179 Acute kidney failure, unspecified: Secondary | ICD-10-CM | POA: Diagnosis present

## 2023-11-06 DIAGNOSIS — D72829 Elevated white blood cell count, unspecified: Secondary | ICD-10-CM | POA: Diagnosis not present

## 2023-11-06 DIAGNOSIS — L089 Local infection of the skin and subcutaneous tissue, unspecified: Secondary | ICD-10-CM

## 2023-11-06 DIAGNOSIS — T148XXA Other injury of unspecified body region, initial encounter: Principal | ICD-10-CM

## 2023-11-06 DIAGNOSIS — S7712XA Crushing injury of left thigh, initial encounter: Secondary | ICD-10-CM | POA: Diagnosis present

## 2023-11-06 DIAGNOSIS — E876 Hypokalemia: Secondary | ICD-10-CM | POA: Diagnosis present

## 2023-11-06 DIAGNOSIS — S8012XA Contusion of left lower leg, initial encounter: Secondary | ICD-10-CM | POA: Diagnosis not present

## 2023-11-06 DIAGNOSIS — E872 Acidosis, unspecified: Secondary | ICD-10-CM | POA: Diagnosis present

## 2023-11-06 DIAGNOSIS — M7989 Other specified soft tissue disorders: Secondary | ICD-10-CM | POA: Diagnosis not present

## 2023-11-06 DIAGNOSIS — I96 Gangrene, not elsewhere classified: Secondary | ICD-10-CM | POA: Diagnosis present

## 2023-11-06 DIAGNOSIS — Z23 Encounter for immunization: Secondary | ICD-10-CM | POA: Diagnosis not present

## 2023-11-06 DIAGNOSIS — Z823 Family history of stroke: Secondary | ICD-10-CM | POA: Diagnosis not present

## 2023-11-06 DIAGNOSIS — R6520 Severe sepsis without septic shock: Secondary | ICD-10-CM | POA: Diagnosis present

## 2023-11-06 DIAGNOSIS — S7012XA Contusion of left thigh, initial encounter: Secondary | ICD-10-CM | POA: Diagnosis present

## 2023-11-06 DIAGNOSIS — A419 Sepsis, unspecified organism: Secondary | ICD-10-CM | POA: Insufficient documentation

## 2023-11-06 DIAGNOSIS — D500 Iron deficiency anemia secondary to blood loss (chronic): Secondary | ICD-10-CM | POA: Diagnosis not present

## 2023-11-06 DIAGNOSIS — Z9889 Other specified postprocedural states: Secondary | ICD-10-CM | POA: Diagnosis not present

## 2023-11-06 LAB — CK: Total CK: 70 U/L (ref 49–397)

## 2023-11-06 LAB — COMPREHENSIVE METABOLIC PANEL WITH GFR
ALT: 19 U/L (ref 0–44)
AST: 22 U/L (ref 15–41)
Albumin: 2.9 g/dL — ABNORMAL LOW (ref 3.5–5.0)
Alkaline Phosphatase: 56 U/L (ref 38–126)
Anion gap: 14 (ref 5–15)
BUN: 39 mg/dL — ABNORMAL HIGH (ref 6–20)
CO2: 18 mmol/L — ABNORMAL LOW (ref 22–32)
Calcium: 8.4 mg/dL — ABNORMAL LOW (ref 8.9–10.3)
Chloride: 99 mmol/L (ref 98–111)
Creatinine, Ser: 1.33 mg/dL — ABNORMAL HIGH (ref 0.61–1.24)
GFR, Estimated: >60 mL/min (ref >60)
Glucose, Bld: 151 mg/dL — ABNORMAL HIGH (ref 70–99)
Potassium: 3.4 mmol/L — ABNORMAL LOW (ref 3.5–5.1)
Sodium: 131 mmol/L — ABNORMAL LOW (ref 135–145)
Total Bilirubin: 1.7 mg/dL — ABNORMAL HIGH (ref 0.0–1.2)
Total Protein: 6.6 g/dL (ref 6.5–8.1)

## 2023-11-06 LAB — CBC WITH DIFFERENTIAL/PLATELET
Abs Immature Granulocytes: 0.09 K/uL — ABNORMAL HIGH (ref 0.00–0.07)
Basophils Absolute: 0 K/uL (ref 0.0–0.1)
Basophils Relative: 1 %
Eosinophils Absolute: 0 K/uL (ref 0.0–0.5)
Eosinophils Relative: 0 %
HCT: 29.4 % — ABNORMAL LOW (ref 39.0–52.0)
Hemoglobin: 10.2 g/dL — ABNORMAL LOW (ref 13.0–17.0)
Immature Granulocytes: 2 %
Lymphocytes Relative: 8 %
Lymphs Abs: 0.4 K/uL — ABNORMAL LOW (ref 0.7–4.0)
MCH: 30.3 pg (ref 26.0–34.0)
MCHC: 34.7 g/dL (ref 30.0–36.0)
MCV: 87.2 fL (ref 80.0–100.0)
Monocytes Absolute: 0.6 K/uL (ref 0.1–1.0)
Monocytes Relative: 10 %
Neutro Abs: 4.4 K/uL (ref 1.7–7.7)
Neutrophils Relative %: 79 %
Platelets: 295 K/uL (ref 150–400)
RBC: 3.37 MIL/uL — ABNORMAL LOW (ref 4.22–5.81)
RDW: 12.5 % (ref 11.5–15.5)
Smear Review: NORMAL
WBC: 5.5 K/uL (ref 4.0–10.5)
nRBC: 0 % (ref 0.0–0.2)

## 2023-11-06 LAB — LACTIC ACID, PLASMA
Lactic Acid, Venous: 2.9 mmol/L (ref 0.5–1.9)
Lactic Acid, Venous: 1.9 mmol/L (ref 0.5–1.9)

## 2023-11-06 MED ORDER — LINEZOLID 600 MG/300ML IV SOLN: 600.0000 mg | Freq: Two times a day (BID) | INTRAVENOUS | Status: DC

## 2023-11-06 MED ORDER — ACETAMINOPHEN 500 MG PO TABS
1000.0000 mg | ORAL_TABLET | Freq: Once | ORAL | Status: AC
Start: 2023-11-06 — End: 2023-11-06

## 2023-11-06 MED ORDER — IOHEXOL 350 MG/ML SOLN
125.0000 mL | Freq: Once | INTRAVENOUS | Status: AC | PRN
  Administered 2023-11-06: 125 mL via INTRAVENOUS

## 2023-11-06 MED ORDER — LACTATED RINGERS IV BOLUS (SEPSIS)
1000.0000 mL | Freq: Once | INTRAVENOUS | Status: AC
  Administered 2023-11-07: 1000 mL via INTRAVENOUS

## 2023-11-06 MED ORDER — LINEZOLID 600 MG/300ML IV SOLN
600.0000 mg | Freq: Two times a day (BID) | INTRAVENOUS | Status: DC
  Administered 2023-11-07 – 2023-11-12 (×10): 600 mg via INTRAVENOUS
  Filled 2023-11-06 (×14): qty 300

## 2023-11-06 MED ORDER — PIPERACILLIN-TAZOBACTAM 3.375 G IVPB
3.3750 g | Freq: Once | INTRAVENOUS | Status: AC
  Administered 2023-11-06: 3.375 g via INTRAVENOUS
  Filled 2023-11-06: qty 50

## 2023-11-06 MED ORDER — SODIUM CHLORIDE 0.9 % IV BOLUS
1000.0000 mL | Freq: Once | INTRAVENOUS | Status: AC
  Administered 2023-11-06: 1000 mL via INTRAVENOUS

## 2023-11-06 MED ORDER — ACETAMINOPHEN 500 MG PO TABS
ORAL_TABLET | ORAL | Status: AC
  Administered 2023-11-06: 1000 mg via ORAL
  Filled 2023-11-06: qty 2

## 2023-11-06 MED ORDER — LINEZOLID 600 MG/300ML IV SOLN
600.0000 mg | Freq: Once | INTRAVENOUS | Status: AC
  Administered 2023-11-07: 600 mg via INTRAVENOUS
  Filled 2023-11-06 (×2): qty 300

## 2023-11-06 NOTE — ED Notes (Signed)
 Dr. Jacolyn said to run Zosyn over 30 minutes not 4 hours like order says. Ortho doctor and Dr. Jacolyn in room talking to pt about the importance of not leaving AMA and how serious it is to stay in the hospital. Pt agreeable and willing to stay. Pt will be NPO at midnight
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
## 2023-11-06 NOTE — Progress Notes (Signed)
 CODE SEPSIS - PHARMACY COMMUNICATION  **Broad Spectrum Antibiotics should be administered within 1 hour of Sepsis diagnosis**  Time Code Sepsis Called/Page Received: 2309  Antibiotics Ordered: Zosyn and Linezolid  Time of 1st antibiotic administration: 2257  Rankin CANDIE Dills, PharmD, [facility] 11/06/2023 11:47 PM
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
## 2023-11-06 NOTE — Progress Notes (Signed)
 Elink monitoring for the code sepsis protocol.
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
## 2023-11-06 NOTE — Assessment & Plan Note (Signed)
 Sepsis Sepsis criteria include fever, tachycardia and tachypnea with lactic acidosis Continue Zosyn Sepsis fluids Pain management Bedrest N.p.o. for the OR in the a.m. Vascular aware and formally consulted Patient has been seen by orthopedics.  Formal consult done to follow

## 2023-11-07 ENCOUNTER — Inpatient Hospital Stay: Admitting: Anesthesiology

## 2023-11-07 ENCOUNTER — Other Ambulatory Visit (INDEPENDENT_AMBULATORY_CARE_PROVIDER_SITE_OTHER): Payer: Self-pay | Admitting: Vascular Surgery

## 2023-11-07 ENCOUNTER — Encounter
Admission: EM | Disposition: A | Discharge status: Home Health | Payer: Self-pay | Source: Home / Self Care | Attending: Internal Medicine

## 2023-11-07 ENCOUNTER — Other Ambulatory Visit: Payer: Self-pay

## 2023-11-07 ENCOUNTER — Encounter: Payer: Self-pay | Admitting: Internal Medicine

## 2023-11-07 DIAGNOSIS — B9561 Methicillin susceptible Staphylococcus aureus infection as the cause of diseases classified elsewhere: Secondary | ICD-10-CM | POA: Diagnosis not present

## 2023-11-07 DIAGNOSIS — S8012XA Contusion of left lower leg, initial encounter: Secondary | ICD-10-CM | POA: Diagnosis not present

## 2023-11-07 DIAGNOSIS — L03116 Cellulitis of left lower limb: Secondary | ICD-10-CM

## 2023-11-07 DIAGNOSIS — S7012XA Contusion of left thigh, initial encounter: Secondary | ICD-10-CM

## 2023-11-07 HISTORY — PX: HEMATOMA EVACUATION: SHX5118

## 2023-11-07 LAB — TYPE AND SCREEN
ABO/RH(D): O NEG
Antibody Screen: NEGATIVE

## 2023-11-07 LAB — CORTISOL-AM, BLOOD: Cortisol - AM: 24.9 ug/dL — ABNORMAL HIGH (ref 6.7–22.6)

## 2023-11-07 LAB — PROTIME-INR
INR: 1.2 (ref 0.8–1.2)
Prothrombin Time: 16.3 s — ABNORMAL HIGH (ref 11.4–15.2)

## 2023-11-07 LAB — HIV ANTIBODY (ROUTINE TESTING W REFLEX): HIV Screen 4th Generation wRfx: NONREACTIVE

## 2023-11-07 SURGERY — EVACUATION HEMATOMA
Anesthesia: General | Laterality: Left

## 2023-11-07 MED ORDER — SUCCINYLCHOLINE CHLORIDE 200 MG/10ML IV SOSY
PREFILLED_SYRINGE | INTRAVENOUS | Status: AC
  Filled 2023-11-07: qty 10

## 2023-11-07 MED ORDER — PROPOFOL 10 MG/ML IV BOLUS
INTRAVENOUS | Status: DC | PRN
  Administered 2023-11-07: 200 mg via INTRAVENOUS

## 2023-11-07 MED ORDER — SODIUM CHLORIDE 0.9 % IV SOLN: INTRAVENOUS | Status: DC

## 2023-11-07 MED ORDER — ONDANSETRON HCL 4 MG PO TABS: 4.0000 mg | ORAL_TABLET | Freq: Four times a day (QID) | ORAL | Status: DC | PRN

## 2023-11-07 MED ORDER — MIDAZOLAM HCL 2 MG/2ML IJ SOLN
INTRAMUSCULAR | Status: AC
  Filled 2023-11-07: qty 2

## 2023-11-07 MED ORDER — ONDANSETRON HCL 4 MG/2ML IJ SOLN
INTRAMUSCULAR | Status: DC | PRN
  Administered 2023-11-07: 4 mg via INTRAVENOUS

## 2023-11-07 MED ORDER — INFLUENZA VIRUS VACC SPLIT PF (FLUZONE) 0.5 ML IM SUSY
0.5000 mL | PREFILLED_SYRINGE | INTRAMUSCULAR | Status: AC
  Administered 2023-11-08: 0.5 mL via INTRAMUSCULAR
  Filled 2023-11-07: qty 0.5

## 2023-11-07 MED ORDER — ONDANSETRON HCL 4 MG/2ML IJ SOLN: 4.0000 mg | Freq: Four times a day (QID) | INTRAMUSCULAR | Status: DC | PRN

## 2023-11-07 MED ORDER — DEXAMETHASONE SODIUM PHOSPHATE 10 MG/ML IJ SOLN
INTRAMUSCULAR | Status: AC
  Filled 2023-11-07: qty 1

## 2023-11-07 MED ORDER — ONDANSETRON HCL 4 MG/2ML IJ SOLN
INTRAMUSCULAR | Status: AC
  Filled 2023-11-07: qty 2

## 2023-11-07 MED ORDER — HYDROCODONE-ACETAMINOPHEN 5-325 MG PO TABS
1.0000 | ORAL_TABLET | ORAL | Status: DC | PRN
  Administered 2023-11-10 – 2023-11-12 (×4): 1 via ORAL
  Filled 2023-11-07 (×4): qty 1

## 2023-11-07 MED ORDER — SODIUM CHLORIDE 0.9 % IR SOLN
Status: DC | PRN
  Administered 2023-11-07: 3000 mL

## 2023-11-07 MED ORDER — ALBUMIN HUMAN 5 % IV SOLN
INTRAVENOUS | Status: AC
  Filled 2023-11-07: qty 250

## 2023-11-07 MED ORDER — DEXAMETHASONE SODIUM PHOSPHATE 10 MG/ML IJ SOLN
INTRAMUSCULAR | Status: DC | PRN
  Administered 2023-11-07: 10 mg via INTRAVENOUS

## 2023-11-07 MED ORDER — DEXMEDETOMIDINE HCL IN NACL 80 MCG/20ML IV SOLN
INTRAVENOUS | Status: DC | PRN
Start: 2023-11-07 — End: 2023-11-07
  Administered 2023-11-07: 8 ug via INTRAVENOUS
  Administered 2023-11-07: 12 ug via INTRAVENOUS

## 2023-11-07 MED ORDER — FENTANYL CITRATE (PF) 100 MCG/2ML IJ SOLN
INTRAMUSCULAR | Status: AC
  Filled 2023-11-07: qty 2

## 2023-11-07 MED ORDER — ALBUMIN HUMAN 5 % IV SOLN: INTRAVENOUS | Status: DC | PRN

## 2023-11-07 MED ORDER — LIDOCAINE HCL (PF) 2 % IJ SOLN
INTRAMUSCULAR | Status: AC
  Filled 2023-11-07: qty 5

## 2023-11-07 MED ORDER — MIDAZOLAM HCL 2 MG/2ML IJ SOLN
INTRAMUSCULAR | Status: DC | PRN
  Administered 2023-11-07: 2 mg via INTRAVENOUS

## 2023-11-07 MED ORDER — ACETAMINOPHEN 650 MG RE SUPP: 650.0000 mg | Freq: Four times a day (QID) | RECTAL | Status: DC | PRN

## 2023-11-07 MED ORDER — PROPOFOL 10 MG/ML IV BOLUS
INTRAVENOUS | Status: AC
  Filled 2023-11-07: qty 20

## 2023-11-07 MED ORDER — FENTANYL CITRATE (PF) 100 MCG/2ML IJ SOLN
INTRAMUSCULAR | Status: DC | PRN
  Administered 2023-11-07: 100 ug via INTRAVENOUS
  Administered 2023-11-07 (×2): 50 ug via INTRAVENOUS

## 2023-11-07 MED ORDER — LIDOCAINE HCL (CARDIAC) PF 100 MG/5ML IV SOSY
PREFILLED_SYRINGE | INTRAVENOUS | Status: DC | PRN
  Administered 2023-11-07: 100 mg via INTRAVENOUS

## 2023-11-07 MED ORDER — 0.9 % SODIUM CHLORIDE (POUR BTL) OPTIME
TOPICAL | Status: DC | PRN
  Administered 2023-11-07: 1000 mL

## 2023-11-07 MED ORDER — LACTATED RINGERS IV SOLN
150.0000 mL/h | INTRAVENOUS | Status: AC
  Administered 2023-11-07: 150 mL/h via INTRAVENOUS

## 2023-11-07 MED ORDER — DROPERIDOL 2.5 MG/ML IJ SOLN: 0.6250 mg | Freq: Once | INTRAMUSCULAR | Status: DC | PRN

## 2023-11-07 MED ORDER — HYDROMORPHONE HCL 1 MG/ML IJ SOLN
1.0000 mg | INTRAMUSCULAR | Status: DC | PRN
Start: 2023-11-06 — End: 2023-11-16
  Administered 2023-11-11: 1 mg via INTRAVENOUS
  Filled 2023-11-07: qty 1

## 2023-11-07 MED ORDER — SUGAMMADEX SODIUM 200 MG/2ML IV SOLN
INTRAVENOUS | Status: DC | PRN
  Administered 2023-11-07: 400 mg via INTRAVENOUS

## 2023-11-07 MED ORDER — PIPERACILLIN-TAZOBACTAM 3.375 G IVPB
3.3750 g | Freq: Three times a day (TID) | INTRAVENOUS | Status: AC
  Administered 2023-11-07 – 2023-11-13 (×19): 3.375 g via INTRAVENOUS
  Filled 2023-11-07 (×19): qty 50

## 2023-11-07 MED ORDER — FENTANYL CITRATE (PF) 100 MCG/2ML IJ SOLN
25.0000 ug | INTRAMUSCULAR | Status: DC | PRN
  Administered 2023-11-07 (×4): 25 ug via INTRAVENOUS

## 2023-11-07 MED ORDER — PHENYLEPHRINE 80 MCG/ML (10ML) SYRINGE FOR IV PUSH (FOR BLOOD PRESSURE SUPPORT)
PREFILLED_SYRINGE | INTRAVENOUS | Status: DC | PRN
  Administered 2023-11-07: 160 ug via INTRAVENOUS

## 2023-11-07 MED ORDER — SUCCINYLCHOLINE CHLORIDE 200 MG/10ML IV SOSY
PREFILLED_SYRINGE | INTRAVENOUS | Status: DC | PRN
  Administered 2023-11-07: 180 mg via INTRAVENOUS

## 2023-11-07 MED ORDER — ACETAMINOPHEN 325 MG PO TABS
650.0000 mg | ORAL_TABLET | Freq: Four times a day (QID) | ORAL | Status: DC | PRN
  Administered 2023-11-09: 650 mg via ORAL
  Filled 2023-11-07: qty 2

## 2023-11-07 MED ORDER — ROCURONIUM BROMIDE 100 MG/10ML IV SOLN
INTRAVENOUS | Status: DC | PRN
  Administered 2023-11-07: 50 mg via INTRAVENOUS

## 2023-11-07 SURGICAL SUPPLY — 29 items
BRUSH SCRUB EZ 4% CHG (MISCELLANEOUS) ×1 IMPLANT
CANISTER WOUND CARE 500ML ATS (WOUND CARE) IMPLANT
CHLORAPREP W/TINT 26 (MISCELLANEOUS) IMPLANT
DRAPE INCISE IOBAN 66X45 STRL (DRAPES) IMPLANT
DRAPE INCISE IOBAN 66X60 STRL (DRAPES) IMPLANT
DRSG VAC GRANUFOAM LG (GAUZE/BANDAGES/DRESSINGS) IMPLANT
ELECT CAUTERY BLADE 6.4 (BLADE) ×1 IMPLANT
ELECTRODE REM PT RTRN 9FT ADLT (ELECTROSURGICAL) ×1 IMPLANT
GLOVE BIO SURGEON STRL SZ7 (GLOVE) ×2 IMPLANT
GOWN STRL REUS W/ TWL LRG LVL3 (GOWN DISPOSABLE) ×2 IMPLANT
GOWN STRL REUS W/TWL 2XL LVL3 (GOWN DISPOSABLE) ×1 IMPLANT
IV 0.9% NACL 1000 ML (IV SOLUTION) IMPLANT
KIT TURNOVER KIT A (KITS) ×1 IMPLANT
LABEL OR SOLS (LABEL) ×1 IMPLANT
MANIFOLD NEPTUNE II (INSTRUMENTS) ×1 IMPLANT
NS IRRIG 500ML POUR BTL (IV SOLUTION) ×1 IMPLANT
PACK EXTREMITY ARMC (MISCELLANEOUS) ×1 IMPLANT
PAD PREP OB/GYN DISP 24X41 (PERSONAL CARE ITEMS) ×1 IMPLANT
PENCIL SMOKE EVACUATOR (MISCELLANEOUS) ×1 IMPLANT
SOL .9 NS 3000ML IRR UROMATIC (IV SOLUTION) IMPLANT
SOLUTION PREP PVP 2OZ (MISCELLANEOUS) ×1 IMPLANT
SPONGE T-LAP 18X18 ~~LOC~~+RFID (SPONGE) ×1 IMPLANT
SUT SILK 2-0 30XBRD TIE 12 (SUTURE) IMPLANT
SUT VIC AB 3-0 SH 27X BRD (SUTURE) IMPLANT
SUTURE ETHLN 4-0 FS2 18XMF BLK (SUTURE) IMPLANT
SYR BULB IRRIG 60ML STRL (SYRINGE) ×1 IMPLANT
TIP FAN IRRIG PULSAVAC PLUS (DISPOSABLE) IMPLANT
TRAP FLUID SMOKE EVACUATOR (MISCELLANEOUS) ×1 IMPLANT
WATER STERILE IRR 500ML POUR (IV SOLUTION) ×1 IMPLANT
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
## 2023-11-07 NOTE — Anesthesia Procedure Notes (Signed)
 Procedure Name: Intubation Date/Time: 11/07/2023 8:31 AM  Performed by: Dominica Krabbe, CRNAPre-anesthesia Checklist: Patient identified, Emergency Drugs available, Suction available, Patient being monitored and Timeout performed Patient Re-evaluated:Patient Re-evaluated prior to induction Oxygen Delivery Method: Circle system utilized Preoxygenation: Pre-oxygenation with 100% oxygen Induction Type: IV induction and Rapid sequence Laryngoscope Size: McGrath and 3 Grade View: Grade I Tube type: Oral Tube size: 7.5 mm Number of attempts: 1 Airway Equipment and Method: Stylet and Video-laryngoscopy Placement Confirmation: ETT inserted through vocal cords under direct vision, positive ETCO2 and breath sounds checked- equal and bilateral Secured at: 21 cm Tube secured with: Tape Dental Injury: Teeth and Oropharynx as per pre-operative assessment
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
## 2023-11-07 NOTE — Anesthesia Postprocedure Evaluation (Signed)
 Anesthesia Post Note  Patient: Andrew Clay  Procedure(s) Performed: EVACUATION HEMATOMA (Left)  Patient location during evaluation: PACU Anesthesia Type: General Level of consciousness: awake and alert Pain management: pain level controlled Vital Signs Assessment: post-procedure vital signs reviewed and stable Respiratory status: spontaneous breathing, nonlabored ventilation, respiratory function stable and patient connected to nasal cannula oxygen Cardiovascular status: blood pressure returned to baseline and stable Postop Assessment: no apparent nausea or vomiting Anesthetic complications: no   No notable events documented.   Last Vitals:  Vitals:   11/07/23 1146 11/07/23 1300  BP: (!) 92/47 100/66  Pulse: 81 86  Resp: 19   Temp: (!) 36.3 C 36.8 C  SpO2: 98% 99%    Last Pain:  Vitals:   11/07/23 1134  TempSrc:   PainSc: 4                  Prentice Murphy

## 2023-11-07 NOTE — TOC CM/SW Note (Signed)
 Transition of Care [facility]) CM/SW Note   Transition of Care [facility]) - Inpatient Brief Assessment   Patient Details  Name: Andrew Clay MRN: [id number] Date of Birth: November 27, 1965  [facility]) CM/SW Contact:    Alfonso Rummer, LCSW Phone Number: 11/07/2023, 3:56 PM   Clinical Narrative:  KEN DELENA Rummer completed TOC chart review. No TOC needs identified. Please contact TOC should needs arise.   Transition of Care Asessment: Insurance and Status: Insurance coverage has been reviewed Patient has primary care physician: Yes Home environment has been reviewed: single family home   Prior/Current Home Services: No current home services Social Drivers of Health Review: SDOH reviewed no interventions necessary Readmission risk has been reviewed: No Transition of care needs: no transition of care needs at this time
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
## 2023-11-07 NOTE — Anesthesia Preprocedure Evaluation (Signed)
 Anesthesia Evaluation  Patient identified by MRN, date of birth, ID band Patient awake    Reviewed: Allergy & Precautions, H&P , NPO status , Patient's Chart, lab work & pertinent test results, reviewed documented beta blocker date and time   History of Anesthesia Complications Negative for: history of anesthetic complications  Airway Mallampati: I  TM Distance: >3 FB Neck ROM: full    Dental  (+) Chipped, Dental Advidsory Given   Pulmonary neg pulmonary ROS   Pulmonary exam normal breath sounds clear to auscultation       Cardiovascular Exercise Tolerance: Good negative cardio ROS Normal cardiovascular exam Rhythm:regular Rate:Normal     Neuro/Psych negative neurological ROS  negative psych ROS   GI/Hepatic negative GI ROS, Neg liver ROS,,,  Endo/Other  negative endocrine ROS    Renal/GU negative Renal ROS  negative genitourinary   Musculoskeletal   Abdominal   Peds  Hematology negative hematology ROS (+)   Anesthesia Other Findings Past Medical History: No date: History of chicken pox 06/06/2004: Uncomplicated herpes simplex   Reproductive/Obstetrics negative OB ROS                              Anesthesia Physical Anesthesia Plan  ASA: 1 and emergent  Anesthesia Plan: General   Post-op Pain Management:    Induction: Intravenous  PONV Risk Score and Plan: 2 and Ondansetron, Dexamethasone, Midazolam and Treatment may vary due to age or medical condition  Airway Management Planned: LMA and Oral ETT  Additional Equipment:   Intra-op Plan:   Post-operative Plan: Extubation in OR  Informed Consent: I have reviewed the patients History and Physical, chart, labs and discussed the procedure including the risks, benefits and alternatives for the proposed anesthesia with the patient or authorized representative who has indicated his/her understanding and acceptance.     Dental  Advisory Given  Plan Discussed with: Anesthesiologist, CRNA and Surgeon  Anesthesia Plan Comments:         Anesthesia Quick Evaluation
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
## 2023-11-07 NOTE — Transfer of Care (Signed)
 Immediate Anesthesia Transfer of Care Note  Patient: Andrew Clay  Procedure(s) Performed: EVACUATION HEMATOMA (Left)  Patient Location: PACU  Anesthesia Type:General  Level of Consciousness: awake, sedated, drowsy, patient cooperative, and responds to stimulation  Airway & Oxygen Therapy: Patient Spontanous Breathing and Patient connected to face mask oxygen  Post-op Assessment: Report given to RN and Post -op Vital signs reviewed and stable  Post vital signs: Reviewed and stable  Last Vitals:  Vitals Value Taken Time  BP 108/71 11/07/23 09:48  Temp    Pulse 92 11/07/23 09:49  Resp 25 11/07/23 09:50  SpO2 100 % 11/07/23 09:49  Vitals shown include unfiled device data.  Last Pain:  Vitals:   11/07/23 0803  TempSrc: Temporal  PainSc: 4          Complications: No notable events documented.

## 2023-11-07 NOTE — ED Notes (Signed)
OR transport at bedside.
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
## 2023-11-08 ENCOUNTER — Encounter: Payer: Self-pay | Admitting: Vascular Surgery

## 2023-11-08 DIAGNOSIS — S8012XA Contusion of left lower leg, initial encounter: Secondary | ICD-10-CM | POA: Diagnosis not present

## 2023-11-08 LAB — CBC
HCT: 23.1 % — ABNORMAL LOW (ref 39.0–52.0)
Hemoglobin: 7.7 g/dL — ABNORMAL LOW (ref 13.0–17.0)
MCH: 29.6 pg (ref 26.0–34.0)
MCHC: 33.3 g/dL (ref 30.0–36.0)
MCV: 88.8 fL (ref 80.0–100.0)
Platelets: 312 K/uL (ref 150–400)
RBC: 2.6 MIL/uL — ABNORMAL LOW (ref 4.22–5.81)
RDW: 13.1 % (ref 11.5–15.5)
WBC: 13 K/uL — ABNORMAL HIGH (ref 4.0–10.5)
nRBC: 0.2 % (ref 0.0–0.2)

## 2023-11-08 LAB — BASIC METABOLIC PANEL WITH GFR
Anion gap: 8 (ref 5–15)
BUN: 28 mg/dL — ABNORMAL HIGH (ref 6–20)
CO2: 22 mmol/L (ref 22–32)
Calcium: 8 mg/dL — ABNORMAL LOW (ref 8.9–10.3)
Chloride: 105 mmol/L (ref 98–111)
Creatinine, Ser: 1.01 mg/dL (ref 0.61–1.24)
GFR, Estimated: >60 mL/min (ref >60)
Glucose, Bld: 203 mg/dL — ABNORMAL HIGH (ref 70–99)
Potassium: 3.3 mmol/L — ABNORMAL LOW (ref 3.5–5.1)
Sodium: 135 mmol/L (ref 135–145)

## 2023-11-08 MED ORDER — POTASSIUM CHLORIDE CRYS ER 20 MEQ PO TBCR
40.0000 meq | EXTENDED_RELEASE_TABLET | Freq: Once | ORAL | Status: AC
  Administered 2023-11-08: 40 meq via ORAL
  Filled 2023-11-08: qty 2

## 2023-11-08 NOTE — Care Management Important Message (Signed)
 Important Message  Patient Details  Name: Andrew Clay MRN: [id number] Date of Birth: 1965-03-21   Important Message Given:  Yes - Medicare IM     Andrew Clay 11/08/2023, 4:37 PM

## 2023-11-08 NOTE — Plan of Care (Signed)
   Problem: Education: Goal: Knowledge of General Education information will improve Description Including pain rating scale, medication(s)/side effects and non-pharmacologic comfort measures Outcome: Progressing

## 2023-11-08 NOTE — Plan of Care (Signed)

## 2023-11-08 NOTE — Progress Notes (Signed)
 Progress Note    11/08/2023 3:40 PM 1 Day Post-Op  Subjective:  Andrew Clay is a 58 yo male who is POD #1 from:   PROCEDURE: Evacuation of massive left thigh liquefied hematoma Irrigation and debridement of approximately 400 cm of skin, soft tissue, and muscle fascia Ligation of left great saphenous vein Pulse lavage irrigation Negative pressure dressing placement left lower extremity   Patient resting comfortably in bed this afternoon. Recovering as expected. He is not requiring any pain medications at this point. No complaints and vitals all remain stable.    Vitals:   11/08/23 0816 11/08/23 1301  BP: 108/69 106/73  Pulse: 68 78  Resp:    Temp: (!) 97.5 F (36.4 C) 98.5 F (36.9 C)  SpO2: 100% 99%   Physical Exam: Cardiac:  RRR, Normal S1, S2. No murmurs Lungs:  Non Labored Breathing, Clear on auscultation throughout, No rales, Rhonchi or wheezing to note.  Incisions:  Left Lower extremity hematoma evacuation to left inner thigh. Wound vac in place and working well.  Extremities:  Right Lower extremity warm with palpable pulses and no crush injuries. Left lower extremity remains swollen with +2 edema. Positive doppler DP/PT pulses. Warm to touch.  Abdomen:  Positive bowel sounds throughout, soft non tender and non distended.  Neurologic: AAOX2, answers questions and follows commands.   CBC    Component Value Date/Time   WBC 13.0 (H) 11/08/2023 0432   RBC 2.60 (L) 11/08/2023 0432   HGB 7.7 (L) 11/08/2023 0432   HGB 15.1 12/06/2015 1205   HCT 23.1 (L) 11/08/2023 0432   HCT 43.9 12/06/2015 1205   PLT 312 11/08/2023 0432   PLT 226 12/06/2015 1205   MCV 88.8 11/08/2023 0432   MCV 89 12/06/2015 1205   MCH 29.6 11/08/2023 0432   MCHC 33.3 11/08/2023 0432   RDW 13.1 11/08/2023 0432   RDW 13.3 12/06/2015 1205   LYMPHSABS 0.4 (L) 11/06/2023 1946   LYMPHSABS 2.2 12/06/2015 1205   MONOABS 0.6 11/06/2023 1946   EOSABS 0.0 11/06/2023 1946   EOSABS 0.1 12/06/2015 1205    BASOSABS 0.0 11/06/2023 1946   BASOSABS 0.0 12/06/2015 1205    BMET    Component Value Date/Time   NA 135 11/08/2023 0432   NA 143 11/09/2014 0851   K 3.3 (L) 11/08/2023 0432   CL 105 11/08/2023 0432   CO2 22 11/08/2023 0432   GLUCOSE 203 (H) 11/08/2023 0432   BUN 28 (H) 11/08/2023 0432   BUN 15 11/09/2014 0851   CREATININE 1.01 11/08/2023 0432   CALCIUM 8.0 (L) 11/08/2023 0432   GFRNONAA >60 11/08/2023 0432   GFRAA 113 11/09/2014 0851    INR    Component Value Date/Time   INR 1.2 11/07/2023 0449     Intake/Output Summary (Last 24 hours) at 11/08/2023 1540 Last data filed at 11/08/2023 1519 Gross per 24 hour  Intake 2593.75 ml  Output 800 ml  Net 1793.75 ml     Assessment/Plan:  58 y.o. male is s/p SEE ABOVE 1 Day Post-Op   Plan is to return to the operating room tomorrow for left lower extremity wound washout with wound vac exchange and possible skin debridement. I had a long detailed discussion with the patient regarding the procedure, benefits risks and complications. Patient verbalized his understanding and wishes to proceed. I answered all his questions this afternoon. Patient will be NPO after midnight tonight for the procedure tomorrow.    I discussed the case in detail with Dr  Selinda Gu MD and he agrees with the plan.   DVT prophylaxis:  None due to hematoma.    Gwendlyn JONELLE [facility]ank [facility] 11/08/2023 3:40 PM
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
## 2023-11-09 ENCOUNTER — Encounter
Admission: EM | Disposition: A | Discharge status: Home Health | Payer: Self-pay | Source: Home / Self Care | Attending: Internal Medicine

## 2023-11-09 ENCOUNTER — Encounter: Payer: Self-pay | Admitting: Internal Medicine

## 2023-11-09 ENCOUNTER — Inpatient Hospital Stay: Admitting: Anesthesiology

## 2023-11-09 ENCOUNTER — Other Ambulatory Visit: Payer: Self-pay

## 2023-11-09 DIAGNOSIS — Z9889 Other specified postprocedural states: Secondary | ICD-10-CM

## 2023-11-09 DIAGNOSIS — S8012XA Contusion of left lower leg, initial encounter: Secondary | ICD-10-CM | POA: Diagnosis not present

## 2023-11-09 DIAGNOSIS — L03116 Cellulitis of left lower limb: Secondary | ICD-10-CM | POA: Diagnosis not present

## 2023-11-09 DIAGNOSIS — S7012XA Contusion of left thigh, initial encounter: Secondary | ICD-10-CM | POA: Diagnosis not present

## 2023-11-09 DIAGNOSIS — B9561 Methicillin susceptible Staphylococcus aureus infection as the cause of diseases classified elsewhere: Secondary | ICD-10-CM | POA: Diagnosis not present

## 2023-11-09 HISTORY — PX: APPLICATION OF WOUND VAC: SHX5189

## 2023-11-09 HISTORY — PX: IRRIGATION AND DEBRIDEMENT HEMATOMA: SHX5254

## 2023-11-09 LAB — CBC
HCT: 22.6 % — ABNORMAL LOW (ref 39.0–52.0)
Hemoglobin: 7.6 g/dL — ABNORMAL LOW (ref 13.0–17.0)
MCH: 29.8 pg (ref 26.0–34.0)
MCHC: 33.6 g/dL (ref 30.0–36.0)
MCV: 88.6 fL (ref 80.0–100.0)
Platelets: 370 K/uL (ref 150–400)
RBC: 2.55 MIL/uL — ABNORMAL LOW (ref 4.22–5.81)
RDW: 13.4 % (ref 11.5–15.5)
WBC: 14.4 K/uL — ABNORMAL HIGH (ref 4.0–10.5)
nRBC: 1.2 % — ABNORMAL HIGH (ref 0.0–0.2)

## 2023-11-09 LAB — BASIC METABOLIC PANEL WITH GFR
Anion gap: 8 (ref 5–15)
BUN: 34 mg/dL — ABNORMAL HIGH (ref 6–20)
CO2: 24 mmol/L (ref 22–32)
Calcium: 8.2 mg/dL — ABNORMAL LOW (ref 8.9–10.3)
Chloride: 108 mmol/L (ref 98–111)
Creatinine, Ser: 1.06 mg/dL (ref 0.61–1.24)
GFR, Estimated: >60 mL/min (ref >60)
Glucose, Bld: 99 mg/dL (ref 70–99)
Potassium: 3.6 mmol/L (ref 3.5–5.1)
Sodium: 140 mmol/L (ref 135–145)

## 2023-11-09 LAB — SURGICAL PATHOLOGY

## 2023-11-09 SURGERY — IRRIGATION AND DEBRIDEMENT HEMATOMA
Anesthesia: General | Laterality: Left

## 2023-11-09 MED ORDER — CHLORHEXIDINE GLUCONATE 4 % EX SOLN: 60.0000 mL | Freq: Once | CUTANEOUS | Status: DC

## 2023-11-09 MED ORDER — DEXAMETHASONE SODIUM PHOSPHATE 10 MG/ML IJ SOLN
INTRAMUSCULAR | Status: DC | PRN
  Administered 2023-11-09: 5 mg via INTRAVENOUS

## 2023-11-09 MED ORDER — HYDROMORPHONE HCL 1 MG/ML IJ SOLN
INTRAMUSCULAR | Status: DC | PRN
  Administered 2023-11-09 (×2): .5 mg via INTRAVENOUS

## 2023-11-09 MED ORDER — FENTANYL CITRATE (PF) 100 MCG/2ML IJ SOLN
25.0000 ug | INTRAMUSCULAR | Status: DC | PRN
  Administered 2023-11-09 (×2): 50 ug via INTRAVENOUS

## 2023-11-09 MED ORDER — ACETAMINOPHEN 10 MG/ML IV SOLN
1000.0000 mg | Freq: Once | INTRAVENOUS | Status: DC | PRN
  Administered 2023-11-09: 1000 mg via INTRAVENOUS

## 2023-11-09 MED ORDER — ACETAMINOPHEN 10 MG/ML IV SOLN
INTRAVENOUS | Status: AC
  Filled 2023-11-09: qty 100

## 2023-11-09 MED ORDER — FENTANYL CITRATE (PF) 100 MCG/2ML IJ SOLN
INTRAMUSCULAR | Status: AC
  Filled 2023-11-09: qty 2

## 2023-11-09 MED ORDER — MIDAZOLAM HCL 2 MG/2ML IJ SOLN
INTRAMUSCULAR | Status: AC
  Filled 2023-11-09: qty 2

## 2023-11-09 MED ORDER — OXYCODONE HCL 5 MG PO TABS
5.0000 mg | ORAL_TABLET | Freq: Once | ORAL | Status: AC | PRN
  Administered 2023-11-09: 5 mg via ORAL

## 2023-11-09 MED ORDER — PROPOFOL 10 MG/ML IV BOLUS
INTRAVENOUS | Status: DC | PRN
  Administered 2023-11-09: 120 mg via INTRAVENOUS

## 2023-11-09 MED ORDER — PROPOFOL 10 MG/ML IV BOLUS
INTRAVENOUS | Status: AC
  Filled 2023-11-09: qty 20

## 2023-11-09 MED ORDER — MIDAZOLAM HCL 2 MG/2ML IJ SOLN
INTRAMUSCULAR | Status: DC | PRN
Start: 2023-11-09 — End: 2023-11-09
  Administered 2023-11-09 (×2): 1 mg via INTRAVENOUS

## 2023-11-09 MED ORDER — 0.9 % SODIUM CHLORIDE (POUR BTL) OPTIME
TOPICAL | Status: DC | PRN
  Administered 2023-11-09: 500 mL

## 2023-11-09 MED ORDER — LACTATED RINGERS IV SOLN: INTRAVENOUS | Status: DC

## 2023-11-09 MED ORDER — HYDROMORPHONE HCL 1 MG/ML IJ SOLN
INTRAMUSCULAR | Status: AC
  Filled 2023-11-09: qty 1

## 2023-11-09 MED ORDER — LIDOCAINE HCL (CARDIAC) PF 100 MG/5ML IV SOSY
PREFILLED_SYRINGE | INTRAVENOUS | Status: DC | PRN
  Administered 2023-11-09: 80 mg via INTRAVENOUS

## 2023-11-09 MED ORDER — EPHEDRINE 5 MG/ML INJ
INTRAVENOUS | Status: AC
  Filled 2023-11-09: qty 5

## 2023-11-09 MED ORDER — OXYCODONE HCL 5 MG/5ML PO SOLN: 5.0000 mg | Freq: Once | ORAL | Status: AC | PRN

## 2023-11-09 MED ORDER — GLYCOPYRROLATE 0.2 MG/ML IJ SOLN
INTRAMUSCULAR | Status: AC
  Filled 2023-11-09: qty 1

## 2023-11-09 MED ORDER — PROPOFOL 1000 MG/100ML IV EMUL
INTRAVENOUS | Status: AC
  Filled 2023-11-09: qty 100

## 2023-11-09 MED ORDER — VASHE WOUND IRRIGATION OPTIME
TOPICAL | Status: DC | PRN
  Administered 2023-11-09: 34 [oz_av]

## 2023-11-09 MED ORDER — FENTANYL CITRATE (PF) 100 MCG/2ML IJ SOLN
INTRAMUSCULAR | Status: DC | PRN
  Administered 2023-11-09 (×2): 50 ug via INTRAVENOUS

## 2023-11-09 MED ORDER — LIDOCAINE HCL (PF) 2 % IJ SOLN
INTRAMUSCULAR | Status: AC
  Filled 2023-11-09: qty 5

## 2023-11-09 MED ORDER — OXYCODONE HCL 5 MG PO TABS
ORAL_TABLET | ORAL | Status: AC
  Filled 2023-11-09: qty 1

## 2023-11-09 MED ORDER — SODIUM CHLORIDE 0.9 % IV SOLN
INTRAVENOUS | Status: DC
Start: 2023-11-09 — End: 2023-11-09

## 2023-11-09 MED ORDER — PHENYLEPHRINE 80 MCG/ML (10ML) SYRINGE FOR IV PUSH (FOR BLOOD PRESSURE SUPPORT)
PREFILLED_SYRINGE | INTRAVENOUS | Status: DC | PRN
  Administered 2023-11-09 (×4): 80 ug via INTRAVENOUS

## 2023-11-09 MED ORDER — PHENYLEPHRINE 80 MCG/ML (10ML) SYRINGE FOR IV PUSH (FOR BLOOD PRESSURE SUPPORT)
PREFILLED_SYRINGE | INTRAVENOUS | Status: AC
Start: 2023-11-09 — End: 2023-11-09
  Filled 2023-11-09: qty 10

## 2023-11-09 MED ORDER — ONDANSETRON HCL 4 MG/2ML IJ SOLN
INTRAMUSCULAR | Status: DC | PRN
  Administered 2023-11-09: 4 mg via INTRAVENOUS

## 2023-11-09 MED ORDER — EPHEDRINE SULFATE-NACL 50-0.9 MG/10ML-% IV SOSY
PREFILLED_SYRINGE | INTRAVENOUS | Status: DC | PRN
  Administered 2023-11-09: 5 mg via INTRAVENOUS

## 2023-11-09 SURGICAL SUPPLY — 35 items
BRUSH SCRUB EZ 4% CHG (MISCELLANEOUS) ×1 IMPLANT
CANISTER WOUND CARE 500ML ATS (WOUND CARE) ×1 IMPLANT
CHLORAPREP W/TINT 26 (MISCELLANEOUS) IMPLANT
CLEANSER WND VASHE 34 (WOUND CARE) IMPLANT
COLLAGEN CELLERATERX 5 GRAM (Miscellaneous) IMPLANT
DRAPE INCISE IOBAN 66X45 STRL (DRAPES) ×1 IMPLANT
DRAPE LAPAROTOMY 100X77 ABD (DRAPES) ×1 IMPLANT
DRSG VAC GRANUFOAM LG (GAUZE/BANDAGES/DRESSINGS) ×1 IMPLANT
DRSG VAC GRANUFOAM MED (GAUZE/BANDAGES/DRESSINGS) ×1 IMPLANT
ELECT CAUTERY BLADE 6.4 (BLADE) ×1 IMPLANT
ELECTRODE REM PT RTRN 9FT ADLT (ELECTROSURGICAL) ×1 IMPLANT
GAUZE 4X4 16PLY ~~LOC~~+RFID DBL (SPONGE) ×1 IMPLANT
GLOVE BIO SURGEON STRL SZ7 (GLOVE) ×2 IMPLANT
GOWN STRL REUS W/ TWL LRG LVL3 (GOWN DISPOSABLE) ×2 IMPLANT
GOWN STRL REUS W/TWL 2XL LVL3 (GOWN DISPOSABLE) ×1 IMPLANT
IV 0.9% NACL 1000 ML (IV SOLUTION) IMPLANT
KIT TURNOVER KIT A (KITS) ×1 IMPLANT
LABEL OR SOLS (LABEL) ×1 IMPLANT
MANIFOLD NEPTUNE II (INSTRUMENTS) ×1 IMPLANT
NS IRRIG 500ML POUR BTL (IV SOLUTION) ×1 IMPLANT
PACK BASIN MINOR ARMC (MISCELLANEOUS) ×1 IMPLANT
PACK EXTREMITY ARMC (MISCELLANEOUS) ×1 IMPLANT
PAD PREP OB/GYN DISP 24X41 (PERSONAL CARE ITEMS) ×1 IMPLANT
PENCIL SMOKE EVACUATOR (MISCELLANEOUS) ×1 IMPLANT
SOLN 0.9% NACL 1000 ML (IV SOLUTION) IMPLANT
SOLN 0.9% NACL POUR BTL 1000ML (IV SOLUTION) ×1 IMPLANT
SOLUTION PREP PVP 2OZ (MISCELLANEOUS) ×1 IMPLANT
SPONGE T-LAP 18X18 ~~LOC~~+RFID (SPONGE) ×1 IMPLANT
SUT VIC AB 3-0 SH 27X BRD (SUTURE) IMPLANT
SUTURE ETHLN 4-0 FS2 18XMF BLK (SUTURE) IMPLANT
SWAB CULTURE AMIES ANAERIB BLU (MISCELLANEOUS) IMPLANT
SYR BULB IRRIG 60ML STRL (SYRINGE) ×1 IMPLANT
TIP FAN IRRIG PULSAVAC PLUS (DISPOSABLE) IMPLANT
TRAP FLUID SMOKE EVACUATOR (MISCELLANEOUS) ×1 IMPLANT
WATER STERILE IRR 500ML POUR (IV SOLUTION) ×1 IMPLANT

## 2023-11-09 NOTE — Interval H&P Note (Signed)
 History and Physical Interval Note:  11/09/2023 11:57 AM  Andrew Clay  has presented today for surgery, with the diagnosis of hematoma left leg.  The various methods of treatment have been discussed with the patient and family. After consideration of risks, benefits and other options for treatment, the patient has consented to  Procedure(s) with comments: IRRIGATION AND DEBRIDEMENT HEMATOMA (Left) - LEFT THIGH WOUND APPLICATION, WOUND VAC (Left) - WOUND VAC EXCHANGE as a surgical intervention.  The patient's history has been reviewed, patient examined, no change in status, stable for surgery.  I have reviewed the patient's chart and labs.  Questions were answered to the patient's satisfaction.     Shaliyah Taite
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
## 2023-11-09 NOTE — Anesthesia Procedure Notes (Signed)
 Procedure Name: LMA Insertion Date/Time: 11/09/2023 12:29 PM  Performed by: Duwayne Craven, CRNAPre-anesthesia Checklist: Patient identified, Patient being monitored, Timeout performed, Emergency Drugs available and Suction available Patient Re-evaluated:Patient Re-evaluated prior to induction Oxygen Delivery Method: Circle system utilized Preoxygenation: Pre-oxygenation with 100% oxygen Induction Type: IV induction Ventilation: Mask ventilation without difficulty LMA: LMA inserted LMA Size: 4.0 Tube type: Oral Number of attempts: 1 Placement Confirmation: positive ETCO2 and breath sounds checked- equal and bilateral Tube secured with: Tape Dental Injury: Teeth and Oropharynx as per pre-operative assessment
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
## 2023-11-09 NOTE — Plan of Care (Signed)

## 2023-11-09 NOTE — Anesthesia Postprocedure Evaluation (Signed)
 Anesthesia Post Note  Patient: Andrew Clay  Procedure(s) Performed: IRRIGATION AND DEBRIDEMENT HEMATOMA (Left) APPLICATION, WOUND VAC (Left)  Patient location during evaluation: PACU Anesthesia Type: General Level of consciousness: awake and alert, oriented and patient cooperative Pain management: pain level controlled Vital Signs Assessment: post-procedure vital signs reviewed and stable Respiratory status: spontaneous breathing, nonlabored ventilation and respiratory function stable Cardiovascular status: blood pressure returned to baseline and stable Postop Assessment: adequate PO intake Anesthetic complications: no   No notable events documented.   Last Vitals:  Vitals:   11/09/23 1112 11/09/23 1310  BP: (!) 120/104 129/89  Pulse: 77 91  Resp: 18 (!) 24  Temp: 36.6 C 36.7 C  SpO2: 99% 100%    Last Pain:  Vitals:   11/09/23 1333  TempSrc:   PainSc: 4                  Hiba Garry

## 2023-11-09 NOTE — Anesthesia Preprocedure Evaluation (Addendum)
 Anesthesia Evaluation  Patient identified by MRN, date of birth, ID band Patient awake    Reviewed: Allergy & Precautions, NPO status , Patient's Chart, lab work & pertinent test results  History of Anesthesia Complications Negative for: history of anesthetic complications  Airway Mallampati: I   Neck ROM: Full    Dental  (+) Missing, Chipped   Pulmonary neg pulmonary ROS   Pulmonary exam normal breath sounds clear to auscultation       Cardiovascular Exercise Tolerance: Good negative cardio ROS Normal cardiovascular exam Rhythm:Regular Rate:Normal  ECG 11/06/23: Sinus tachycardia with irregular rate   Neuro/Psych negative neurological ROS     GI/Hepatic negative GI ROS,,,  Endo/Other  negative endocrine ROS    Renal/GU negative Renal ROS     Musculoskeletal   Abdominal   Peds  Hematology  (+) Blood dyscrasia, anemia   Anesthesia Other Findings   Reproductive/Obstetrics                              Anesthesia Physical Anesthesia Plan  ASA: 1  Anesthesia Plan: General   Post-op Pain Management:    Induction: Intravenous  PONV Risk Score and Plan: 2 and Ondansetron, Dexamethasone and Treatment may vary due to age or medical condition  Airway Management Planned: LMA  Additional Equipment:   Intra-op Plan:   Post-operative Plan: Extubation in OR  Informed Consent: I have reviewed the patients History and Physical, chart, labs and discussed the procedure including the risks, benefits and alternatives for the proposed anesthesia with the patient or authorized representative who has indicated his/her understanding and acceptance.     Dental advisory given  Plan Discussed with: CRNA  Anesthesia Plan Comments: (Patient consented for risks of anesthesia including but not limited to:  - adverse reactions to medications - damage to eyes, teeth, lips or other oral mucosa -  nerve damage due to positioning  - sore throat or hoarseness - damage to heart, brain, nerves, lungs, other parts of body or loss of life  Informed patient about role of CRNA in peri- and intra-operative care.  Patient voiced understanding.)         Anesthesia Quick Evaluation

## 2023-11-09 NOTE — Transfer of Care (Signed)
 Immediate Anesthesia Transfer of Care Note  Patient: Andrew Clay  Procedure(s) Performed: IRRIGATION AND DEBRIDEMENT HEMATOMA (Left) APPLICATION, WOUND VAC (Left)  Patient Location: PACU  Anesthesia Type:General  Level of Consciousness: awake, alert , and oriented  Airway & Oxygen Therapy: Patient Spontanous Breathing  Post-op Assessment: Report given to RN and Post -op Vital signs reviewed and stable  Post vital signs: Reviewed and stable  Last Vitals:  Vitals Value Taken Time  BP 129/89 11/09/23 13:10  Temp    Pulse 84 11/09/23 13:13  Resp 10 11/09/23 13:13  SpO2 100 % 11/09/23 13:13  Vitals shown include unfiled device data.  Last Pain:  Vitals:   11/09/23 1112  TempSrc: Temporal  PainSc: 3       Patients Stated Pain Goal: 0 (11/08/23 2000)  Complications: No notable events documented.

## 2023-11-10 DIAGNOSIS — S8012XA Contusion of left lower leg, initial encounter: Secondary | ICD-10-CM | POA: Diagnosis not present

## 2023-11-10 LAB — BASIC METABOLIC PANEL WITH GFR
Anion gap: 8 (ref 5–15)
BUN: 36 mg/dL — ABNORMAL HIGH (ref 6–20)
CO2: 25 mmol/L (ref 22–32)
Calcium: 7.7 mg/dL — ABNORMAL LOW (ref 8.9–10.3)
Chloride: 106 mmol/L (ref 98–111)
Creatinine, Ser: 1.09 mg/dL (ref 0.61–1.24)
GFR, Estimated: >60 mL/min (ref >60)
Glucose, Bld: 133 mg/dL — ABNORMAL HIGH (ref 70–99)
Potassium: 3.4 mmol/L — ABNORMAL LOW (ref 3.5–5.1)
Sodium: 139 mmol/L (ref 135–145)

## 2023-11-10 LAB — CBC
HCT: 23 % — ABNORMAL LOW (ref 39.0–52.0)
Hemoglobin: 7.6 g/dL — ABNORMAL LOW (ref 13.0–17.0)
MCH: 29.7 pg (ref 26.0–34.0)
MCHC: 33 g/dL (ref 30.0–36.0)
MCV: 89.8 fL (ref 80.0–100.0)
Platelets: 437 K/uL — ABNORMAL HIGH (ref 150–400)
RBC: 2.56 MIL/uL — ABNORMAL LOW (ref 4.22–5.81)
RDW: 13.8 % (ref 11.5–15.5)
WBC: 19 K/uL — ABNORMAL HIGH (ref 4.0–10.5)
nRBC: 1 % — ABNORMAL HIGH (ref 0.0–0.2)

## 2023-11-10 MED ORDER — POTASSIUM CHLORIDE CRYS ER 20 MEQ PO TBCR
20.0000 meq | EXTENDED_RELEASE_TABLET | Freq: Once | ORAL | Status: AC
  Administered 2023-11-10: 20 meq via ORAL
  Filled 2023-11-10: qty 1

## 2023-11-10 MED ORDER — POLYETHYLENE GLYCOL 3350 17 G PO PACK
17.0000 g | PACK | Freq: Every day | ORAL | Status: DC
  Administered 2023-11-10: 17 g via ORAL
  Filled 2023-11-10 (×6): qty 1

## 2023-11-10 MED ORDER — DOCUSATE SODIUM 100 MG PO CAPS
200.0000 mg | ORAL_CAPSULE | Freq: Two times a day (BID) | ORAL | Status: DC
  Administered 2023-11-10: 200 mg via ORAL
  Filled 2023-11-10 (×9): qty 2

## 2023-11-10 NOTE — Plan of Care (Signed)
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
## 2023-11-11 DIAGNOSIS — S8012XA Contusion of left lower leg, initial encounter: Secondary | ICD-10-CM | POA: Diagnosis not present

## 2023-11-11 LAB — BASIC METABOLIC PANEL WITH GFR
Anion gap: 7 (ref 5–15)
BUN: 31 mg/dL — ABNORMAL HIGH (ref 6–20)
CO2: 24 mmol/L (ref 22–32)
Calcium: 7.8 mg/dL — ABNORMAL LOW (ref 8.9–10.3)
Chloride: 109 mmol/L (ref 98–111)
Creatinine, Ser: 1.01 mg/dL (ref 0.61–1.24)
GFR, Estimated: >60 mL/min (ref >60)
Glucose, Bld: 101 mg/dL — ABNORMAL HIGH (ref 70–99)
Potassium: 4 mmol/L (ref 3.5–5.1)
Sodium: 140 mmol/L (ref 135–145)

## 2023-11-11 LAB — CBC
HCT: 23.8 % — ABNORMAL LOW (ref 39.0–52.0)
Hemoglobin: 7.8 g/dL — ABNORMAL LOW (ref 13.0–17.0)
MCH: 29.9 pg (ref 26.0–34.0)
MCHC: 32.8 g/dL (ref 30.0–36.0)
MCV: 91.2 fL (ref 80.0–100.0)
Platelets: 431 K/uL — ABNORMAL HIGH (ref 150–400)
RBC: 2.61 MIL/uL — ABNORMAL LOW (ref 4.22–5.81)
RDW: 13.9 % (ref 11.5–15.5)
WBC: 22 K/uL — ABNORMAL HIGH (ref 4.0–10.5)
nRBC: 0.5 % — ABNORMAL HIGH (ref 0.0–0.2)

## 2023-11-11 LAB — CULTURE, BLOOD (ROUTINE X 2)
Culture: NO GROWTH
Culture: NO GROWTH
Special Requests: ADEQUATE

## 2023-11-11 MED ORDER — ENSURE PLUS HIGH PROTEIN PO LIQD
237.0000 mL | Freq: Two times a day (BID) | ORAL | Status: DC
Start: 2023-11-11 — End: 2023-11-16
  Administered 2023-11-11 – 2023-11-14 (×5): 237 mL via ORAL
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
## 2023-11-11 NOTE — Plan of Care (Signed)
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
## 2023-11-12 ENCOUNTER — Encounter: Payer: Self-pay | Admitting: Internal Medicine

## 2023-11-12 ENCOUNTER — Other Ambulatory Visit: Payer: Self-pay

## 2023-11-12 ENCOUNTER — Inpatient Hospital Stay: Admitting: Certified Registered Nurse Anesthetist

## 2023-11-12 ENCOUNTER — Encounter
Admission: EM | Disposition: A | Discharge status: Home Health | Payer: Self-pay | Source: Home / Self Care | Attending: Internal Medicine

## 2023-11-12 DIAGNOSIS — B9561 Methicillin susceptible Staphylococcus aureus infection as the cause of diseases classified elsewhere: Secondary | ICD-10-CM | POA: Diagnosis not present

## 2023-11-12 DIAGNOSIS — S8012XA Contusion of left lower leg, initial encounter: Secondary | ICD-10-CM | POA: Diagnosis not present

## 2023-11-12 DIAGNOSIS — S7712XA Crushing injury of left thigh, initial encounter: Secondary | ICD-10-CM | POA: Diagnosis not present

## 2023-11-12 DIAGNOSIS — S7012XA Contusion of left thigh, initial encounter: Secondary | ICD-10-CM | POA: Diagnosis not present

## 2023-11-12 DIAGNOSIS — Z9889 Other specified postprocedural states: Secondary | ICD-10-CM | POA: Diagnosis not present

## 2023-11-12 DIAGNOSIS — D72829 Elevated white blood cell count, unspecified: Secondary | ICD-10-CM | POA: Diagnosis not present

## 2023-11-12 HISTORY — PX: APPLICATION OF WOUND VAC: SHX5189

## 2023-11-12 HISTORY — PX: IRRIGATION AND DEBRIDEMENT HEMATOMA: SHX5254

## 2023-11-12 LAB — CBC
HCT: 24.6 % — ABNORMAL LOW (ref 39.0–52.0)
Hemoglobin: 7.9 g/dL — ABNORMAL LOW (ref 13.0–17.0)
MCH: 29.5 pg (ref 26.0–34.0)
MCHC: 32.1 g/dL (ref 30.0–36.0)
MCV: 91.8 fL (ref 80.0–100.0)
Platelets: 436 K/uL — ABNORMAL HIGH (ref 150–400)
RBC: 2.68 MIL/uL — ABNORMAL LOW (ref 4.22–5.81)
RDW: 14.2 % (ref 11.5–15.5)
WBC: 20.6 K/uL — ABNORMAL HIGH (ref 4.0–10.5)
nRBC: 0 % (ref 0.0–0.2)

## 2023-11-12 LAB — HEPATIC FUNCTION PANEL
ALT: 46 U/L — ABNORMAL HIGH (ref 0–44)
AST: 51 U/L — ABNORMAL HIGH (ref 15–41)
Albumin: 1.8 g/dL — ABNORMAL LOW (ref 3.5–5.0)
Alkaline Phosphatase: 43 U/L (ref 38–126)
Bilirubin, Direct: 0.2 mg/dL (ref 0.0–0.2)
Indirect Bilirubin: 0.4 mg/dL (ref 0.3–0.9)
Total Bilirubin: 0.6 mg/dL (ref 0.0–1.2)
Total Protein: 4.8 g/dL — ABNORMAL LOW (ref 6.5–8.1)

## 2023-11-12 LAB — BASIC METABOLIC PANEL WITH GFR
Anion gap: 13 (ref 5–15)
BUN: 39 mg/dL — ABNORMAL HIGH (ref 6–20)
CO2: 22 mmol/L (ref 22–32)
Calcium: 8 mg/dL — ABNORMAL LOW (ref 8.9–10.3)
Chloride: 105 mmol/L (ref 98–111)
Creatinine, Ser: 1.21 mg/dL (ref 0.61–1.24)
GFR, Estimated: >60 mL/min (ref >60)
Glucose, Bld: 112 mg/dL — ABNORMAL HIGH (ref 70–99)
Potassium: 4.3 mmol/L (ref 3.5–5.1)
Sodium: 140 mmol/L (ref 135–145)

## 2023-11-12 LAB — MRSA NEXT GEN BY PCR, NASAL: MRSA by PCR Next Gen: NOT DETECTED

## 2023-11-12 LAB — CK: Total CK: 14 U/L — ABNORMAL LOW (ref 49–397)

## 2023-11-12 SURGERY — IRRIGATION AND DEBRIDEMENT HEMATOMA
Anesthesia: General | Site: Leg Upper | Laterality: Left

## 2023-11-12 MED ORDER — CEFAZOLIN SODIUM-DEXTROSE 2-4 GM/100ML-% IV SOLN
INTRAVENOUS | Status: AC
  Filled 2023-11-12: qty 100

## 2023-11-12 MED ORDER — FENTANYL CITRATE (PF) 100 MCG/2ML IJ SOLN
25.0000 ug | INTRAMUSCULAR | Status: DC | PRN
  Administered 2023-11-12 (×2): 50 ug via INTRAVENOUS

## 2023-11-12 MED ORDER — CHLORHEXIDINE GLUCONATE 4 % EX SOLN
60.0000 mL | Freq: Once | CUTANEOUS | Status: DC
Start: 2023-11-12 — End: 2023-11-12

## 2023-11-12 MED ORDER — ONDANSETRON HCL 4 MG/2ML IJ SOLN
INTRAMUSCULAR | Status: DC | PRN
  Administered 2023-11-12: 4 mg via INTRAVENOUS

## 2023-11-12 MED ORDER — ACETAMINOPHEN 10 MG/ML IV SOLN
INTRAVENOUS | Status: DC | PRN
  Administered 2023-11-12: 1000 mg via INTRAVENOUS

## 2023-11-12 MED ORDER — SODIUM CHLORIDE 0.9 % IV SOLN: INTRAVENOUS | Status: DC

## 2023-11-12 MED ORDER — VASHE WOUND IRRIGATION OPTIME
TOPICAL | Status: DC | PRN
  Administered 2023-11-12: 34 [oz_av] via TOPICAL

## 2023-11-12 MED ORDER — OXYCODONE HCL 5 MG/5ML PO SOLN: 5.0000 mg | Freq: Once | ORAL | Status: DC | PRN

## 2023-11-12 MED ORDER — 0.9 % SODIUM CHLORIDE (POUR BTL) OPTIME
TOPICAL | Status: DC | PRN
  Administered 2023-11-12: 500 mL

## 2023-11-12 MED ORDER — LIDOCAINE HCL (CARDIAC) PF 100 MG/5ML IV SOSY
PREFILLED_SYRINGE | INTRAVENOUS | Status: DC | PRN
  Administered 2023-11-12: 100 mg via INTRAVENOUS

## 2023-11-12 MED ORDER — ACETAMINOPHEN 10 MG/ML IV SOLN: 1000.0000 mg | Freq: Once | INTRAVENOUS | Status: DC | PRN

## 2023-11-12 MED ORDER — PROPOFOL 500 MG/50ML IV EMUL
INTRAVENOUS | Status: DC | PRN
  Administered 2023-11-12: 100 ug/kg/min via INTRAVENOUS
  Administered 2023-11-12: 60 mg via INTRAVENOUS
  Administered 2023-11-12: 40 mg via INTRAVENOUS

## 2023-11-12 MED ORDER — FENTANYL CITRATE (PF) 100 MCG/2ML IJ SOLN
INTRAMUSCULAR | Status: AC
  Filled 2023-11-12: qty 2

## 2023-11-12 MED ORDER — CHLORHEXIDINE GLUCONATE 4 % EX SOLN: 60.0000 mL | Freq: Once | CUTANEOUS | Status: DC

## 2023-11-12 MED ORDER — DEXAMETHASONE SODIUM PHOSPHATE 10 MG/ML IJ SOLN
INTRAMUSCULAR | Status: DC | PRN
Start: 2023-11-12 — End: 2023-11-12
  Administered 2023-11-12: 10 mg via INTRAVENOUS

## 2023-11-12 MED ORDER — LACTATED RINGERS IV SOLN: INTRAVENOUS | Status: DC

## 2023-11-12 MED ORDER — FENTANYL CITRATE (PF) 100 MCG/2ML IJ SOLN
INTRAMUSCULAR | Status: DC | PRN
  Administered 2023-11-12 (×4): 25 ug via INTRAVENOUS

## 2023-11-12 MED ORDER — ACETAMINOPHEN 10 MG/ML IV SOLN
INTRAVENOUS | Status: AC
  Filled 2023-11-12: qty 100

## 2023-11-12 MED ORDER — OXYCODONE HCL 5 MG PO TABS: 5.0000 mg | ORAL_TABLET | Freq: Once | ORAL | Status: DC | PRN

## 2023-11-12 MED ORDER — MIDAZOLAM HCL 2 MG/2ML IJ SOLN
INTRAMUSCULAR | Status: AC
  Filled 2023-11-12: qty 2

## 2023-11-12 MED ORDER — CEFAZOLIN SODIUM-DEXTROSE 2-4 GM/100ML-% IV SOLN
2.0000 g | INTRAVENOUS | Status: AC
  Administered 2023-11-12: 2 g via INTRAVENOUS

## 2023-11-12 MED ORDER — MIDAZOLAM HCL 2 MG/2ML IJ SOLN
INTRAMUSCULAR | Status: DC | PRN
  Administered 2023-11-12: 2 mg via INTRAVENOUS

## 2023-11-12 MED ORDER — KETOROLAC TROMETHAMINE 30 MG/ML IJ SOLN
INTRAMUSCULAR | Status: DC | PRN
  Administered 2023-11-12: 30 mg via INTRAVENOUS

## 2023-11-12 MED ORDER — PROPOFOL 1000 MG/100ML IV EMUL
INTRAVENOUS | Status: AC
  Filled 2023-11-12: qty 100

## 2023-11-12 SURGICAL SUPPLY — 40 items
BNDG COHESIVE 4X5 TAN STRL LF (GAUZE/BANDAGES/DRESSINGS) IMPLANT
BRUSH SCRUB EZ 4% CHG (MISCELLANEOUS) ×1 IMPLANT
CANISTER WOUND CARE 500ML ATS (WOUND CARE) ×1 IMPLANT
CHLORAPREP W/TINT 26 (MISCELLANEOUS) IMPLANT
CLEANSER WND VASHE 34 (WOUND CARE) IMPLANT
COLLAGEN CELLERATERX 5 GRAM (Miscellaneous) IMPLANT
DRAPE EXTREMITY 106X87X128.5 (DRAPES) IMPLANT
DRAPE IMP U-DRAPE 54X76 (DRAPES) IMPLANT
DRAPE INCISE IOBAN 66X45 STRL (DRAPES) ×1 IMPLANT
DRAPE LAPAROTOMY 100X77 ABD (DRAPES) ×1 IMPLANT
DRAPE SHEET LG 3/4 BI-LAMINATE (DRAPES) IMPLANT
DRSG VAC GRANUFOAM LG (GAUZE/BANDAGES/DRESSINGS) ×1 IMPLANT
DRSG VAC GRANUFOAM MED (GAUZE/BANDAGES/DRESSINGS) ×1 IMPLANT
ELECT CAUTERY BLADE 6.4 (BLADE) ×1 IMPLANT
ELECTRODE REM PT RTRN 9FT ADLT (ELECTROSURGICAL) ×1 IMPLANT
GAUZE 4X4 16PLY ~~LOC~~+RFID DBL (SPONGE) ×1 IMPLANT
GLOVE BIO SURGEON STRL SZ7 (GLOVE) ×2 IMPLANT
GOWN STRL REUS W/ TWL LRG LVL3 (GOWN DISPOSABLE) ×2 IMPLANT
GOWN STRL REUS W/TWL 2XL LVL3 (GOWN DISPOSABLE) ×1 IMPLANT
IV 0.9% NACL 1000 ML (IV SOLUTION) IMPLANT
KIT TURNOVER KIT A (KITS) ×1 IMPLANT
LABEL OR SOLS (LABEL) ×1 IMPLANT
MANIFOLD NEPTUNE II (INSTRUMENTS) ×1 IMPLANT
NS IRRIG 500ML POUR BTL (IV SOLUTION) ×1 IMPLANT
PACK BASIN MINOR ARMC (MISCELLANEOUS) ×1 IMPLANT
PACK EXTREMITY ARMC (MISCELLANEOUS) ×1 IMPLANT
PAD PREP OB/GYN DISP 24X41 (PERSONAL CARE ITEMS) ×1 IMPLANT
PENCIL SMOKE EVACUATOR (MISCELLANEOUS) ×1 IMPLANT
SOLN 0.9% NACL 1000 ML (IV SOLUTION) IMPLANT
SOLN 0.9% NACL POUR BTL 1000ML (IV SOLUTION) ×1 IMPLANT
SOLUTION PREP PVP 2OZ (MISCELLANEOUS) ×1 IMPLANT
SPONGE T-LAP 18X18 ~~LOC~~+RFID (SPONGE) ×1 IMPLANT
STOCKINETTE M/LG 89821 (MISCELLANEOUS) IMPLANT
SUT VIC AB 3-0 SH 27X BRD (SUTURE) IMPLANT
SUTURE ETHLN 4-0 FS2 18XMF BLK (SUTURE) IMPLANT
SWAB CULTURE AMIES ANAERIB BLU (MISCELLANEOUS) IMPLANT
SYR BULB IRRIG 60ML STRL (SYRINGE) ×1 IMPLANT
TIP FAN IRRIG PULSAVAC PLUS (DISPOSABLE) IMPLANT
TRAP FLUID SMOKE EVACUATOR (MISCELLANEOUS) ×1 IMPLANT
WATER STERILE IRR 500ML POUR (IV SOLUTION) ×1 IMPLANT

## 2023-11-12 NOTE — Anesthesia Postprocedure Evaluation (Signed)
 Anesthesia Post Note  Patient: Andrew Clay  Procedure(s) Performed: IRRIGATION AND DEBRIDEMENT HEMATOMA (Left: Leg Upper) APPLICATION, WOUND VAC (Left: Leg Upper)  Patient location during evaluation: PACU Anesthesia Type: General Level of consciousness: awake and alert, oriented and patient cooperative Pain management: pain level controlled Vital Signs Assessment: post-procedure vital signs reviewed and stable Respiratory status: spontaneous breathing, nonlabored ventilation and respiratory function stable Cardiovascular status: blood pressure returned to baseline and stable Postop Assessment: adequate PO intake Anesthetic complications: no   No notable events documented.   Last Vitals:  Vitals:   11/12/23 1145 11/12/23 1206  BP: (!) 148/99 (!) 142/98  Pulse: 71 66  Resp:  18  Temp: (!) 36.2 C 36.4 C  SpO2: 98% 97%    Last Pain:  Vitals:   11/12/23 1206  TempSrc: Oral  PainSc:                  Shaneque Merkle
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
## 2023-11-12 NOTE — Transfer of Care (Signed)
 Immediate Anesthesia Transfer of Care Note  Patient: Andrew Clay  Procedure(s) Performed: IRRIGATION AND DEBRIDEMENT HEMATOMA (Left: Leg Upper) APPLICATION, WOUND VAC (Left: Leg Upper)  Patient Location: PACU  Anesthesia Type:General  Level of Consciousness: awake, alert , and oriented  Airway & Oxygen Therapy: Patient Spontanous Breathing and Patient connected to face mask oxygen  Post-op Assessment: Report given to RN and Post -op Vital signs reviewed and stable  Post vital signs: Reviewed and stable  Last Vitals:  Vitals Value Taken Time  BP 130/81 11/12/23 11:12  Temp 36.2 C 11/12/23 11:12  Pulse 61 11/12/23 11:12  Resp 14 11/12/23 11:12  SpO2 100 % 11/12/23 11:12    Last Pain:  Vitals:   11/12/23 0953  TempSrc: Temporal  PainSc: 0-No pain      Patients Stated Pain Goal: 3 (11/09/23 1545)  Complications: No notable events documented.

## 2023-11-12 NOTE — Interval H&P Note (Signed)
 History and Physical Interval Note:  11/12/2023 7:29 AM  Andrew Clay  has presented today for surgery, with the diagnosis of hematoma left leg.  The various methods of treatment have been discussed with the patient and family. After consideration of risks, benefits and other options for treatment, the patient has consented to  Procedure(s) with comments: IRRIGATION AND DEBRIDEMENT HEMATOMA (Left) - LEFT THIGH WOUND APPLICATION, WOUND VAC (Left) - WOUND VAC EXCHANGE as a surgical intervention.  The patient's history has been reviewed, patient examined, no change in status, stable for surgery.  I have reviewed the patient's chart and labs.  Questions were answered to the patient's satisfaction.     Jaymien Landin

## 2023-11-12 NOTE — Progress Notes (Signed)
 Hibiclens bath given and teeth brushed. Patient tolerated well.
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
## 2023-11-12 NOTE — Op Note (Signed)
    OPERATIVE NOTE   PROCEDURE: Irrigation VAC dressing change left thigh  PRE-OPERATIVE DIAGNOSIS: Massive left thigh hematoma with large skin and soft tissue defect after evacuation last week  POST-OPERATIVE DIAGNOSIS: Same as above  SURGEON: Selinda Gu, MD  ASSISTANT(S): Gwendlyn Shank, NP  ANESTHESIA: MAC  ESTIMATED BLOOD LOSS: 10 cc  FINDING(S): None  SPECIMEN(S): None  INDICATIONS:   Andrew Clay is a 58 y.o. male who presents with a huge left thigh wound after previous hematoma evacuation last week.  He is brought to the operating room for a VAC dressing change and irrigation with potential debridement of any further skin or soft tissue was nonviable.  DESCRIPTION: After obtaining full informed written consent, the patient was brought back to the operating room and placed supine upon the operating table.  The patient received IV antibiotics prior to induction.  After obtaining adequate anesthesia, the patient was prepped and draped in the standard fashion.  The wound was then opened and actually appeared fairly healthy with no further nonviable skin or soft tissue requiring debridement at this time.  The wound was irrigated copiously with a liter of Vashe irrigation.  After irrigation, the wound was measured and was found to be a little over 30 cm in length and about 18 cm in width.  The depth was now about 2 to 3 cm but there was still almost 10 cm of tunneling laterally and inferiorly.  A 5 mm vial of Celerate was placed in the wound predominantly in the areas of tunneling to try to improve tissue growth.  2 large VAC sponge's were then used to create a negative pressure dressing.  These were tucked inferiorly and laterally to try to remove any dead space.  The entirety of 2 sponges were required.  Strips of [place] were used and a good occlusive seal was obtained when connected to suction. The patient was then awakened from anesthesia and taken to the recovery room in stable  condition having tolerated the procedure well.  COMPLICATIONS: none  CONDITION: stable  Selinda Gu  11/12/2023, 10:58 AM   This note was created with Dragon Medical transcription system. Any errors in dictation are purely unintentional.

## 2023-11-12 NOTE — Anesthesia Preprocedure Evaluation (Signed)
 Anesthesia Evaluation  Patient identified by MRN, date of birth, ID band Patient awake    Reviewed: Allergy & Precautions, NPO status , Patient's Chart, lab work & pertinent test results  History of Anesthesia Complications Negative for: history of anesthetic complications  Airway Mallampati: I   Neck ROM: Full    Dental  (+) Missing, Chipped   Pulmonary neg pulmonary ROS   Pulmonary exam normal breath sounds clear to auscultation       Cardiovascular Exercise Tolerance: Good negative cardio ROS Normal cardiovascular exam Rhythm:Regular Rate:Normal  ECG 11/06/23: Sinus tachycardia with irregular rate   Neuro/Psych negative neurological ROS     GI/Hepatic negative GI ROS,,,  Endo/Other  negative endocrine ROS    Renal/GU negative Renal ROS     Musculoskeletal   Abdominal   Peds  Hematology  (+) Blood dyscrasia, anemia   Anesthesia Other Findings   Reproductive/Obstetrics                              Anesthesia Physical Anesthesia Plan  ASA: 1  Anesthesia Plan: General   Post-op Pain Management:    Induction: Intravenous  PONV Risk Score and Plan: 2 and Ondansetron, Dexamethasone and Treatment may vary due to age or medical condition  Airway Management Planned: LMA  Additional Equipment:   Intra-op Plan:   Post-operative Plan: Extubation in OR  Informed Consent: I have reviewed the patients History and Physical, chart, labs and discussed the procedure including the risks, benefits and alternatives for the proposed anesthesia with the patient or authorized representative who has indicated his/her understanding and acceptance.     Dental advisory given  Plan Discussed with: CRNA  Anesthesia Plan Comments: (Patient consented for risks of anesthesia including but not limited to:  - adverse reactions to medications - damage to eyes, teeth, lips or other oral mucosa -  nerve damage due to positioning  - sore throat or hoarseness - damage to heart, brain, nerves, lungs, other parts of body or loss of life  Informed patient about role of CRNA in peri- and intra-operative care.  Patient voiced understanding.)         Anesthesia Quick Evaluation

## 2023-11-12 NOTE — Progress Notes (Signed)
 Refused the ordered Hibiclens bath at this time.

## 2023-11-13 ENCOUNTER — Encounter: Payer: Self-pay | Admitting: Vascular Surgery

## 2023-11-13 DIAGNOSIS — S7012XA Contusion of left thigh, initial encounter: Secondary | ICD-10-CM | POA: Diagnosis not present

## 2023-11-13 DIAGNOSIS — D72829 Elevated white blood cell count, unspecified: Secondary | ICD-10-CM

## 2023-11-13 DIAGNOSIS — S8012XA Contusion of left lower leg, initial encounter: Secondary | ICD-10-CM | POA: Diagnosis not present

## 2023-11-13 DIAGNOSIS — Z9889 Other specified postprocedural states: Secondary | ICD-10-CM | POA: Diagnosis not present

## 2023-11-13 DIAGNOSIS — D500 Iron deficiency anemia secondary to blood loss (chronic): Secondary | ICD-10-CM

## 2023-11-13 DIAGNOSIS — I96 Gangrene, not elsewhere classified: Secondary | ICD-10-CM

## 2023-11-13 LAB — BASIC METABOLIC PANEL WITH GFR
Anion gap: 5 (ref 5–15)
BUN: 54 mg/dL — ABNORMAL HIGH (ref 6–20)
CO2: 23 mmol/L (ref 22–32)
Calcium: 7.9 mg/dL — ABNORMAL LOW (ref 8.9–10.3)
Chloride: 109 mmol/L (ref 98–111)
Creatinine, Ser: 1.31 mg/dL — ABNORMAL HIGH (ref 0.61–1.24)
GFR, Estimated: >60 mL/min (ref >60)
Glucose, Bld: 110 mg/dL — ABNORMAL HIGH (ref 70–99)
Potassium: 4.2 mmol/L (ref 3.5–5.1)
Sodium: 137 mmol/L (ref 135–145)

## 2023-11-13 LAB — CBC
HCT: 21.8 % — ABNORMAL LOW (ref 39.0–52.0)
Hemoglobin: 7.3 g/dL — ABNORMAL LOW (ref 13.0–17.0)
MCH: 30.3 pg (ref 26.0–34.0)
MCHC: 33.5 g/dL (ref 30.0–36.0)
MCV: 90.5 fL (ref 80.0–100.0)
Platelets: 442 K/uL — ABNORMAL HIGH (ref 150–400)
RBC: 2.41 MIL/uL — ABNORMAL LOW (ref 4.22–5.81)
RDW: 14.1 % (ref 11.5–15.5)
WBC: 17.6 K/uL — ABNORMAL HIGH (ref 4.0–10.5)
nRBC: 0 % (ref 0.0–0.2)

## 2023-11-13 MED ORDER — ENOXAPARIN SODIUM 40 MG/0.4ML IJ SOSY
40.0000 mg | PREFILLED_SYRINGE | Freq: Every day | INTRAMUSCULAR | Status: DC
  Administered 2023-11-13 – 2023-11-15 (×3): 40 mg via SUBCUTANEOUS
  Filled 2023-11-13 (×3): qty 0.4

## 2023-11-13 MED ORDER — SODIUM CHLORIDE 0.9 % IV SOLN
3.0000 g | Freq: Four times a day (QID) | INTRAVENOUS | Status: DC
  Administered 2023-11-13 – 2023-11-16 (×13): 3 g via INTRAVENOUS
  Filled 2023-11-13 (×15): qty 8

## 2023-11-13 MED ORDER — TETANUS-DIPHTHERIA TOXOIDS TD 5-2 LFU IM INJ
0.5000 mL | INJECTION | Freq: Once | INTRAMUSCULAR | Status: AC
Start: 2023-11-14 — End: 2023-11-14
  Administered 2023-11-14: 0.5 mL via INTRAMUSCULAR
  Filled 2023-11-13: qty 0.5
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
## 2023-11-13 NOTE — Progress Notes (Signed)
 Progress Note    11/13/2023 11:50 AM 1 Day Post-Op  Subjective:  Andrew Clay is a 58 yo male who is POD #1 from:    PROCEDURE:11/12/2023 Irrigation VAC dressing change left thigh   PROCEDURE: 11/09/2023 Irrigation and debridement of skin and soft tissue of the left thigh.  Amount of debridement difficult to measure but the total wound size is now about 32 cm in length and about 19 cm in width.  Placement of celebrate and placement of a negative pressure VAC dressing.  PROCEDURE: 11/07/2023 Evacuation of massive left thigh liquefied hematoma Irrigation and debridement of approximately 400 cm of skin, soft tissue, and muscle fascia Ligation of left great saphenous vein Pulse lavage irrigation Negative pressure dressing placement left lower extremity  Resting comfortably this morning. Denies any pain. No complaints overnight. Vitals all remain stable.   Vitals:   11/13/23 0312 11/13/23 0745  BP: 136/78 134/86  Pulse: 72 72  Resp: 18 17  Temp: 97.9 F (36.6 C) 98.1 F (36.7 C)  SpO2: 98% 98%   Physical Exam: Cardiac:  RRR, Normal S1, S2. No murmurs Lungs:  Non Labored Breathing, Clear on auscultation throughout, No rales, Rhonchi or wheezing to note.  Incisions:  Left Lower extremity hematoma evacuation to left inner thigh. Wound vac in place and working well.  Extremities:  Right Lower extremity warm with palpable pulses and no crush injuries. Left lower extremity remains swollen with +2 edema. Positive doppler DP/PT pulses. Warm to touch.  Abdomen:  Positive bowel sounds throughout, soft non tender and non distended.  Neurologic: AAOX2, answers questions and follows commands. Highly forgetful.    CBC    Component Value Date/Time   WBC 17.6 (H) 11/13/2023 0324   RBC 2.41 (L) 11/13/2023 0324   HGB 7.3 (L) 11/13/2023 0324   HGB 15.1 12/06/2015 1205   HCT 21.8 (L) 11/13/2023 0324   HCT 43.9 12/06/2015 1205   PLT 442 (H) 11/13/2023 0324   PLT 226 12/06/2015 1205    MCV 90.5 11/13/2023 0324   MCV 89 12/06/2015 1205   MCH 30.3 11/13/2023 0324   MCHC 33.5 11/13/2023 0324   RDW 14.1 11/13/2023 0324   RDW 13.3 12/06/2015 1205   LYMPHSABS 0.4 (L) 11/06/2023 1946   LYMPHSABS 2.2 12/06/2015 1205   MONOABS 0.6 11/06/2023 1946   EOSABS 0.0 11/06/2023 1946   EOSABS 0.1 12/06/2015 1205   BASOSABS 0.0 11/06/2023 1946   BASOSABS 0.0 12/06/2015 1205    BMET    Component Value Date/Time   NA 137 11/13/2023 0324   NA 143 11/09/2014 0851   K 4.2 11/13/2023 0324   CL 109 11/13/2023 0324   CO2 23 11/13/2023 0324   GLUCOSE 110 (H) 11/13/2023 0324   BUN 54 (H) 11/13/2023 0324   BUN 15 11/09/2014 0851   CREATININE 1.31 (H) 11/13/2023 0324   CALCIUM 7.9 (L) 11/13/2023 0324   GFRNONAA >60 11/13/2023 0324   GFRAA 113 11/09/2014 0851    INR    Component Value Date/Time   INR 1.2 11/07/2023 0449     Intake/Output Summary (Last 24 hours) at 11/13/2023 1150 Last data filed at 11/13/2023 0745 Gross per 24 hour  Intake 900 ml  Output 1200 ml  Net -300 ml     Assessment/Plan:  58 y.o. male is s/p SEE ABOVE  1 Day Post-Op   PLAN Vascular Surgery plans on taking the patient back to the operating room on Thursday 11/15/2023 for another wound washout with wound vac replacement.  Again I discussed in detail with the patient the procedure, benefits, risks and complications. He verbalizes his understanding and wishes to proceed. Patient will be made NPO prior to procedure.   Plan is to discharge to hom eon Friday and follow up with Dr Lowery at [facility] early next week.    DVT prophylaxis:  None   Gwendlyn JONELLE Shank [facility] 11/13/2023 11:50 AM
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
## 2023-11-14 DIAGNOSIS — S7012XA Contusion of left thigh, initial encounter: Secondary | ICD-10-CM | POA: Diagnosis not present

## 2023-11-14 DIAGNOSIS — S8012XA Contusion of left lower leg, initial encounter: Secondary | ICD-10-CM | POA: Diagnosis not present

## 2023-11-14 DIAGNOSIS — Z9889 Other specified postprocedural states: Secondary | ICD-10-CM | POA: Diagnosis not present

## 2023-11-14 LAB — AEROBIC/ANAEROBIC CULTURE W GRAM STAIN (SURGICAL/DEEP WOUND)

## 2023-11-14 LAB — BASIC METABOLIC PANEL WITH GFR
Anion gap: 5 (ref 5–15)
BUN: 46 mg/dL — ABNORMAL HIGH (ref 6–20)
CO2: 24 mmol/L (ref 22–32)
Calcium: 8 mg/dL — ABNORMAL LOW (ref 8.9–10.3)
Chloride: 111 mmol/L (ref 98–111)
Creatinine, Ser: 1.26 mg/dL — ABNORMAL HIGH (ref 0.61–1.24)
GFR, Estimated: >60 mL/min (ref >60)
Glucose, Bld: 94 mg/dL (ref 70–99)
Potassium: 4.5 mmol/L (ref 3.5–5.1)
Sodium: 140 mmol/L (ref 135–145)

## 2023-11-14 LAB — FOLATE: Folate: 6.8 ng/mL (ref >5.9)

## 2023-11-14 LAB — CBC
HCT: 22.8 % — ABNORMAL LOW (ref 39.0–52.0)
Hemoglobin: 7.4 g/dL — ABNORMAL LOW (ref 13.0–17.0)
MCH: 29.7 pg (ref 26.0–34.0)
MCHC: 32.5 g/dL (ref 30.0–36.0)
MCV: 91.6 fL (ref 80.0–100.0)
Platelets: 404 K/uL — ABNORMAL HIGH (ref 150–400)
RBC: 2.49 MIL/uL — ABNORMAL LOW (ref 4.22–5.81)
RDW: 14.1 % (ref 11.5–15.5)
WBC: 15.4 K/uL — ABNORMAL HIGH (ref 4.0–10.5)
nRBC: 0 % (ref 0.0–0.2)

## 2023-11-14 LAB — IRON AND TIBC
Iron: 33 ug/dL — ABNORMAL LOW (ref 45–182)
Saturation Ratios: 13 % — ABNORMAL LOW (ref 17.9–39.5)
TIBC: 246 ug/dL — ABNORMAL LOW (ref 250–450)
UIBC: 213 ug/dL

## 2023-11-14 LAB — VITAMIN B12: Vitamin B-12: 1983 pg/mL — ABNORMAL HIGH (ref 180–914)

## 2023-11-14 MED ORDER — FOLIC ACID 1 MG PO TABS
1.0000 mg | ORAL_TABLET | Freq: Every day | ORAL | Status: DC
  Administered 2023-11-14 – 2023-11-16 (×3): 1 mg via ORAL
  Filled 2023-11-14 (×3): qty 1

## 2023-11-14 MED ORDER — SODIUM CHLORIDE 0.9 % IV SOLN: INTRAVENOUS | Status: DC

## 2023-11-14 MED ORDER — POLYSACCHARIDE IRON COMPLEX 150 MG PO CAPS
150.0000 mg | ORAL_CAPSULE | Freq: Every day | ORAL | Status: DC
  Administered 2023-11-14 – 2023-11-16 (×3): 150 mg via ORAL
  Filled 2023-11-14 (×3): qty 1

## 2023-11-14 MED ORDER — CHLORHEXIDINE GLUCONATE 4 % EX SOLN
60.0000 mL | Freq: Once | CUTANEOUS | Status: AC
  Administered 2023-11-15: 4 via TOPICAL

## 2023-11-14 MED ORDER — CHLORHEXIDINE GLUCONATE 4 % EX SOLN
60.0000 mL | Freq: Once | CUTANEOUS | Status: AC
  Administered 2023-11-14: 4 via TOPICAL

## 2023-11-14 MED ORDER — VITAMIN C 500 MG PO TABS
500.0000 mg | ORAL_TABLET | Freq: Every day | ORAL | Status: DC
  Administered 2023-11-14 – 2023-11-16 (×3): 500 mg via ORAL
  Filled 2023-11-14 (×3): qty 1

## 2023-11-14 NOTE — Plan of Care (Signed)
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
## 2023-11-14 NOTE — Progress Notes (Signed)
 Progress Note    11/14/2023 11:20 AM 2 Days Post-Op  Subjective:  Andrew Clay is a 58 yo male who is POD #2 from:      PROCEDURE:11/12/2023 Irrigation VAC dressing change left thigh     PROCEDURE: 11/09/2023 Irrigation and debridement of skin and soft tissue of the left thigh.  Amount of debridement difficult to measure but the total wound size is now about 32 cm in length and about 19 cm in width.  Placement of celebrate and placement of a negative pressure VAC dressing.   PROCEDURE: 11/07/2023 Evacuation of massive left thigh liquefied hematoma Irrigation and debridement of approximately 400 cm of skin, soft tissue, and muscle fascia Ligation of left great saphenous vein Pulse lavage irrigation Negative pressure dressing placement left lower extremity   Resting comfortably this morning. Denies any pain. No complaints overnight. Vitals all remain stable.   Vitals:   11/14/23 0311 11/14/23 0758  BP: (!) 139/90 139/87  Pulse: 72 71  Resp: 18   Temp: 97.9 F (36.6 C) 98.3 F (36.8 C)  SpO2: 99% 99%   Physical Exam: Cardiac:  RRR, Normal S1, S2. No murmurs Lungs:  Non Labored Breathing, Clear on auscultation throughout, No rales, Rhonchi or wheezing to note.  Incisions:  Left Lower extremity hematoma evacuation to left inner thigh. Wound vac in place and working well.  Extremities:  Right Lower extremity warm with palpable pulses and no crush injuries. Left lower extremity remains swollen with +2 edema. Positive doppler DP/PT pulses. Warm to touch.  Abdomen:  Positive bowel sounds throughout, soft non tender and non distended.  Neurologic: AAOX2, answers questions and follows commands. Highly forgetful.   CBC    Component Value Date/Time   WBC 15.4 (H) 11/14/2023 0340   RBC 2.49 (L) 11/14/2023 0340   HGB 7.4 (L) 11/14/2023 0340   HGB 15.1 12/06/2015 1205   HCT 22.8 (L) 11/14/2023 0340   HCT 43.9 12/06/2015 1205   PLT 404 (H) 11/14/2023 0340   PLT 226 12/06/2015  1205   MCV 91.6 11/14/2023 0340   MCV 89 12/06/2015 1205   MCH 29.7 11/14/2023 0340   MCHC 32.5 11/14/2023 0340   RDW 14.1 11/14/2023 0340   RDW 13.3 12/06/2015 1205   LYMPHSABS 0.4 (L) 11/06/2023 1946   LYMPHSABS 2.2 12/06/2015 1205   MONOABS 0.6 11/06/2023 1946   EOSABS 0.0 11/06/2023 1946   EOSABS 0.1 12/06/2015 1205   BASOSABS 0.0 11/06/2023 1946   BASOSABS 0.0 12/06/2015 1205    BMET    Component Value Date/Time   NA 140 11/14/2023 0340   NA 143 11/09/2014 0851   K 4.5 11/14/2023 0340   CL 111 11/14/2023 0340   CO2 24 11/14/2023 0340   GLUCOSE 94 11/14/2023 0340   BUN 46 (H) 11/14/2023 0340   BUN 15 11/09/2014 0851   CREATININE 1.26 (H) 11/14/2023 0340   CALCIUM 8.0 (L) 11/14/2023 0340   GFRNONAA >60 11/14/2023 0340   GFRAA 113 11/09/2014 0851    INR    Component Value Date/Time   INR 1.2 11/07/2023 0449     Intake/Output Summary (Last 24 hours) at 11/14/2023 1120 Last data filed at 11/14/2023 1026 Gross per 24 hour  Intake 460 ml  Output 1800 ml  Net -1340 ml     Assessment/Plan:  58 y.o. male is s/p SEE ABOVE  2 Days Post-Op   PLAN Vascular Surgery plans on taking the patient back to the operating room on Thursday 11/15/2023 for another wound  washout with wound vac replacement.  Again I discussed in detail with the patient the procedure, benefits, risks and complications. He verbalizes his understanding and wishes to proceed. Patient will be made NPO prior to procedure.    Plan is to discharge to home on Friday and follow up with Dr Lowery at [facility] early next week.      DVT prophylaxis: Lovenox 40 mg SQ Q24   Zoeya Gramajo R [facility] 11/14/2023 11:20 AM

## 2023-11-14 NOTE — H&P (View-Only) (Signed)
 Progress Note    11/14/2023 11:20 AM 2 Days Post-Op  Subjective:  Andrew Clay is a 58 yo male who is POD #2 from:      PROCEDURE:11/12/2023 Irrigation VAC dressing change left thigh     PROCEDURE: 11/09/2023 Irrigation and debridement of skin and soft tissue of the left thigh.  Amount of debridement difficult to measure but the total wound size is now about 32 cm in length and about 19 cm in width.  Placement of celebrate and placement of a negative pressure VAC dressing.   PROCEDURE: 11/07/2023 Evacuation of massive left thigh liquefied hematoma Irrigation and debridement of approximately 400 cm of skin, soft tissue, and muscle fascia Ligation of left great saphenous vein Pulse lavage irrigation Negative pressure dressing placement left lower extremity   Resting comfortably this morning. Denies any pain. No complaints overnight. Vitals all remain stable.   Vitals:   11/14/23 0311 11/14/23 0758  BP: (!) 139/90 139/87  Pulse: 72 71  Resp: 18   Temp: 97.9 F (36.6 C) 98.3 F (36.8 C)  SpO2: 99% 99%   Physical Exam: Cardiac:  RRR, Normal S1, S2. No murmurs Lungs:  Non Labored Breathing, Clear on auscultation throughout, No rales, Rhonchi or wheezing to note.  Incisions:  Left Lower extremity hematoma evacuation to left inner thigh. Wound vac in place and working well.  Extremities:  Right Lower extremity warm with palpable pulses and no crush injuries. Left lower extremity remains swollen with +2 edema. Positive doppler DP/PT pulses. Warm to touch.  Abdomen:  Positive bowel sounds throughout, soft non tender and non distended.  Neurologic: AAOX2, answers questions and follows commands. Highly forgetful.   CBC    Component Value Date/Time   WBC 15.4 (H) 11/14/2023 0340   RBC 2.49 (L) 11/14/2023 0340   HGB 7.4 (L) 11/14/2023 0340   HGB 15.1 12/06/2015 1205   HCT 22.8 (L) 11/14/2023 0340   HCT 43.9 12/06/2015 1205   PLT 404 (H) 11/14/2023 0340   PLT 226 12/06/2015  1205   MCV 91.6 11/14/2023 0340   MCV 89 12/06/2015 1205   MCH 29.7 11/14/2023 0340   MCHC 32.5 11/14/2023 0340   RDW 14.1 11/14/2023 0340   RDW 13.3 12/06/2015 1205   LYMPHSABS 0.4 (L) 11/06/2023 1946   LYMPHSABS 2.2 12/06/2015 1205   MONOABS 0.6 11/06/2023 1946   EOSABS 0.0 11/06/2023 1946   EOSABS 0.1 12/06/2015 1205   BASOSABS 0.0 11/06/2023 1946   BASOSABS 0.0 12/06/2015 1205    BMET    Component Value Date/Time   NA 140 11/14/2023 0340   NA 143 11/09/2014 0851   K 4.5 11/14/2023 0340   CL 111 11/14/2023 0340   CO2 24 11/14/2023 0340   GLUCOSE 94 11/14/2023 0340   BUN 46 (H) 11/14/2023 0340   BUN 15 11/09/2014 0851   CREATININE 1.26 (H) 11/14/2023 0340   CALCIUM 8.0 (L) 11/14/2023 0340   GFRNONAA >60 11/14/2023 0340   GFRAA 113 11/09/2014 0851    INR    Component Value Date/Time   INR 1.2 11/07/2023 0449     Intake/Output Summary (Last 24 hours) at 11/14/2023 1120 Last data filed at 11/14/2023 1026 Gross per 24 hour  Intake 460 ml  Output 1800 ml  Net -1340 ml     Assessment/Plan:  58 y.o. male is s/p SEE ABOVE  2 Days Post-Op   PLAN Vascular Surgery plans on taking the patient back to the operating room on Thursday 11/15/2023 for another wound  washout with wound vac replacement.  Again I discussed in detail with the patient the procedure, benefits, risks and complications. He verbalizes his understanding and wishes to proceed. Patient will be made NPO prior to procedure.    Plan is to discharge to home on Friday and follow up with Dr Lowery at [facility] early next week.      DVT prophylaxis: Lovenox 40 mg SQ Q24   Zoeya Gramajo R [facility] 11/14/2023 11:20 AM

## 2023-11-15 ENCOUNTER — Encounter: Payer: Self-pay | Admitting: Internal Medicine

## 2023-11-15 ENCOUNTER — Inpatient Hospital Stay: Admitting: Registered Nurse

## 2023-11-15 ENCOUNTER — Encounter
Admission: EM | Disposition: A | Discharge status: Home Health | Payer: Self-pay | Source: Home / Self Care | Attending: Internal Medicine

## 2023-11-15 DIAGNOSIS — S71102A Unspecified open wound, left thigh, initial encounter: Secondary | ICD-10-CM

## 2023-11-15 DIAGNOSIS — M7989 Other specified soft tissue disorders: Secondary | ICD-10-CM

## 2023-11-15 DIAGNOSIS — S8012XA Contusion of left lower leg, initial encounter: Secondary | ICD-10-CM | POA: Diagnosis not present

## 2023-11-15 HISTORY — PX: APPLICATION OF WOUND VAC: SHX5189

## 2023-11-15 HISTORY — PX: INCISION AND DRAINAGE OF WOUND: SHX1803

## 2023-11-15 LAB — CBC
HCT: 23.7 % — ABNORMAL LOW (ref 39.0–52.0)
Hemoglobin: 7.4 g/dL — ABNORMAL LOW (ref 13.0–17.0)
MCH: 29.2 pg (ref 26.0–34.0)
MCHC: 31.2 g/dL (ref 30.0–36.0)
MCV: 93.7 fL (ref 80.0–100.0)
Platelets: 370 K/uL (ref 150–400)
RBC: 2.53 MIL/uL — ABNORMAL LOW (ref 4.22–5.81)
RDW: 14 % (ref 11.5–15.5)
WBC: 15.7 K/uL — ABNORMAL HIGH (ref 4.0–10.5)
nRBC: 0 % (ref 0.0–0.2)

## 2023-11-15 LAB — VITAMIN D 25 HYDROXY (VIT D DEFICIENCY, FRACTURES)

## 2023-11-15 LAB — BASIC METABOLIC PANEL WITH GFR
Anion gap: 6 (ref 5–15)
BUN: 36 mg/dL — ABNORMAL HIGH (ref 6–20)
CO2: 24 mmol/L (ref 22–32)
Calcium: 8 mg/dL — ABNORMAL LOW (ref 8.9–10.3)
Chloride: 112 mmol/L — ABNORMAL HIGH (ref 98–111)
Creatinine, Ser: 1.06 mg/dL (ref 0.61–1.24)
GFR, Estimated: >60 mL/min (ref >60)
Glucose, Bld: 92 mg/dL (ref 70–99)
Potassium: 4.4 mmol/L (ref 3.5–5.1)
Sodium: 142 mmol/L (ref 135–145)

## 2023-11-15 LAB — HEMOGLOBIN AND HEMATOCRIT, BLOOD
HCT: 24.2 % — ABNORMAL LOW (ref 39.0–52.0)
Hemoglobin: 7.7 g/dL — ABNORMAL LOW (ref 13.0–17.0)

## 2023-11-15 LAB — MAGNESIUM: Magnesium: 2.2 mg/dL (ref 1.7–2.4)

## 2023-11-15 LAB — PHOSPHORUS: Phosphorus: 3.4 mg/dL (ref 2.5–4.6)

## 2023-11-15 SURGERY — IRRIGATION AND DEBRIDEMENT WOUND
Anesthesia: General | Laterality: Left

## 2023-11-15 MED ORDER — VITAMIN D (ERGOCALCIFEROL) 1.25 MG (50000 UNIT) PO CAPS
50000.0000 [IU] | ORAL_CAPSULE | ORAL | Status: DC
Start: 2023-11-15 — End: 2024-01-10
  Administered 2023-11-15: 50000 [IU] via ORAL
  Filled 2023-11-15: qty 1

## 2023-11-15 MED ORDER — OXYCODONE HCL 5 MG PO TABS
ORAL_TABLET | ORAL | Status: AC
  Filled 2023-11-15: qty 1

## 2023-11-15 MED ORDER — PROPOFOL 10 MG/ML IV BOLUS
INTRAVENOUS | Status: AC
  Filled 2023-11-15: qty 20

## 2023-11-15 MED ORDER — MIDAZOLAM HCL 2 MG/2ML IJ SOLN
INTRAMUSCULAR | Status: DC | PRN
  Administered 2023-11-15: 2 mg via INTRAVENOUS

## 2023-11-15 MED ORDER — 0.9 % SODIUM CHLORIDE (POUR BTL) OPTIME
TOPICAL | Status: DC | PRN
  Administered 2023-11-15: 500 mL

## 2023-11-15 MED ORDER — MIDAZOLAM HCL 2 MG/2ML IJ SOLN
INTRAMUSCULAR | Status: AC
  Filled 2023-11-15: qty 2

## 2023-11-15 MED ORDER — LIDOCAINE HCL (PF) 2 % IJ SOLN
INTRAMUSCULAR | Status: DC | PRN
Start: 2023-11-15 — End: 2023-11-15
  Administered 2023-11-15: 100 mg via INTRADERMAL

## 2023-11-15 MED ORDER — LACTATED RINGERS IV SOLN: INTRAVENOUS | Status: DC

## 2023-11-15 MED ORDER — PROPOFOL 500 MG/50ML IV EMUL
INTRAVENOUS | Status: DC | PRN
  Administered 2023-11-15: 125 ug/kg/min via INTRAVENOUS

## 2023-11-15 MED ORDER — OXYCODONE HCL 5 MG/5ML PO SOLN: 5.0000 mg | Freq: Once | ORAL | Status: AC | PRN

## 2023-11-15 MED ORDER — FENTANYL CITRATE (PF) 100 MCG/2ML IJ SOLN: 25.0000 ug | INTRAMUSCULAR | Status: DC | PRN

## 2023-11-15 MED ORDER — FENTANYL CITRATE (PF) 100 MCG/2ML IJ SOLN
INTRAMUSCULAR | Status: AC
  Filled 2023-11-15: qty 2

## 2023-11-15 MED ORDER — PROPOFOL 10 MG/ML IV BOLUS
INTRAVENOUS | Status: DC | PRN
  Administered 2023-11-15: 50 mg via INTRAVENOUS

## 2023-11-15 MED ORDER — FENTANYL CITRATE (PF) 100 MCG/2ML IJ SOLN
INTRAMUSCULAR | Status: DC | PRN
  Administered 2023-11-15 (×2): 50 ug via INTRAVENOUS

## 2023-11-15 MED ORDER — VASHE WOUND IRRIGATION OPTIME
TOPICAL | Status: DC | PRN
  Administered 2023-11-15: 34 [oz_av] via TOPICAL

## 2023-11-15 MED ORDER — OXYCODONE HCL 5 MG PO TABS
5.0000 mg | ORAL_TABLET | Freq: Once | ORAL | Status: AC | PRN
  Administered 2023-11-15: 5 mg via ORAL

## 2023-11-15 MED ORDER — ACETAMINOPHEN 10 MG/ML IV SOLN: 1000.0000 mg | Freq: Once | INTRAVENOUS | Status: DC | PRN

## 2023-11-15 SURGICAL SUPPLY — 35 items
BRUSH SCRUB EZ 4% CHG (MISCELLANEOUS) ×1 IMPLANT
CANISTER WOUND CARE 500ML ATS (WOUND CARE) ×1 IMPLANT
CHLORAPREP W/TINT 26 (MISCELLANEOUS) IMPLANT
CLEANSER WND VASHE 34 (WOUND CARE) IMPLANT
DRAPE INCISE IOBAN 66X45 STRL (DRAPES) ×1 IMPLANT
DRAPE LAPAROTOMY 100X77 ABD (DRAPES) ×1 IMPLANT
DRSG VAC GRANUFOAM LG (GAUZE/BANDAGES/DRESSINGS) ×1 IMPLANT
DRSG VAC GRANUFOAM MED (GAUZE/BANDAGES/DRESSINGS) ×1 IMPLANT
ELECT CAUTERY BLADE 6.4 (BLADE) ×1 IMPLANT
ELECTRODE REM PT RTRN 9FT ADLT (ELECTROSURGICAL) ×1 IMPLANT
GAUZE 4X4 16PLY ~~LOC~~+RFID DBL (SPONGE) ×1 IMPLANT
GLOVE BIO SURGEON STRL SZ7 (GLOVE) ×2 IMPLANT
GOWN STRL REUS W/ TWL LRG LVL3 (GOWN DISPOSABLE) ×2 IMPLANT
GOWN STRL REUS W/TWL 2XL LVL3 (GOWN DISPOSABLE) ×1 IMPLANT
IV 0.9% NACL 1000 ML (IV SOLUTION) IMPLANT
KIT TURNOVER KIT A (KITS) ×1 IMPLANT
LABEL OR SOLS (LABEL) ×1 IMPLANT
MANIFOLD NEPTUNE II (INSTRUMENTS) ×1 IMPLANT
NS IRRIG 500ML POUR BTL (IV SOLUTION) ×1 IMPLANT
PACK BASIN MINOR ARMC (MISCELLANEOUS) ×1 IMPLANT
PACK EXTREMITY ARMC (MISCELLANEOUS) ×1 IMPLANT
PAD PREP OB/GYN DISP 24X41 (PERSONAL CARE ITEMS) ×1 IMPLANT
PENCIL SMOKE EVACUATOR (MISCELLANEOUS) ×1 IMPLANT
SOLN 0.9% NACL 1000 ML (IV SOLUTION) IMPLANT
SOLN 0.9% NACL POUR BTL 1000ML (IV SOLUTION) ×1 IMPLANT
SOLUTION PREP PVP 2OZ (MISCELLANEOUS) ×1 IMPLANT
SPONGE T-LAP 18X18 ~~LOC~~+RFID (SPONGE) ×1 IMPLANT
STOCKINETTE M/LG 89821 (MISCELLANEOUS) IMPLANT
SUT VIC AB 3-0 SH 27X BRD (SUTURE) IMPLANT
SUTURE ETHLN 4-0 FS2 18XMF BLK (SUTURE) IMPLANT
SWAB CULTURE AMIES ANAERIB BLU (MISCELLANEOUS) IMPLANT
SYR BULB IRRIG 60ML STRL (SYRINGE) ×1 IMPLANT
TIP FAN IRRIG PULSAVAC PLUS (DISPOSABLE) IMPLANT
TRAP FLUID SMOKE EVACUATOR (MISCELLANEOUS) ×1 IMPLANT
WATER STERILE IRR 500ML POUR (IV SOLUTION) ×1 IMPLANT
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
## 2023-11-15 NOTE — Plan of Care (Signed)
  Problem: Respiratory: Goal: Ability to maintain adequate ventilation will improve Outcome: Progressing   Problem: Education: Goal: Knowledge of General Education information will improve Description: Including pain rating scale, medication(s)/side effects and non-pharmacologic comfort measures Outcome: Progressing   

## 2023-11-15 NOTE — Anesthesia Postprocedure Evaluation (Signed)
 Anesthesia Post Note  Patient: Andrew Clay  Procedure(s) Performed: IRRIGATION AND DEBRIDEMENT WOUND (Left) APPLICATION, WOUND VAC (Left)  Patient location during evaluation: PACU Anesthesia Type: General Level of consciousness: awake and alert, oriented and patient cooperative Pain management: pain level controlled Vital Signs Assessment: post-procedure vital signs reviewed and stable Respiratory status: spontaneous breathing, nonlabored ventilation and respiratory function stable Cardiovascular status: blood pressure returned to baseline and stable Postop Assessment: adequate PO intake Anesthetic complications: no   No notable events documented.   Last Vitals:  Vitals:   11/15/23 1030 11/15/23 1045  BP: (!) 144/90 (!) 156/94  Pulse: 74 78  Resp: 16 17  Temp:    SpO2: 97% 98%    Last Pain:  Vitals:   11/15/23 1054  TempSrc:   PainSc: 3                  Alonnah Lampkins
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
## 2023-11-15 NOTE — Interval H&P Note (Signed)
 History and Physical Interval Note:  11/15/2023 8:59 AM  Andrew Clay  has presented today for surgery, with the diagnosis of Left Lower Extremity Hematoma.  The various methods of treatment have been discussed with the patient and family. After consideration of risks, benefits and other options for treatment, the patient has consented to  Procedure(s) with comments: IRRIGATION AND DEBRIDEMENT WOUND (Left) APPLICATION, WOUND VAC (Left) - WOUND VAC EXCHANGE as a surgical intervention.  The patient's history has been reviewed, patient examined, no change in status, stable for surgery.  I have reviewed the patient's chart and labs.  Questions were answered to the patient's satisfaction.     [name]

## 2023-11-15 NOTE — Transfer of Care (Signed)
 Immediate Anesthesia Transfer of Care Note  Patient: Andrew Clay  Procedure(s) Performed: IRRIGATION AND DEBRIDEMENT WOUND (Left) APPLICATION, WOUND VAC (Left)  Patient Location: PACU  Anesthesia Type:General  Level of Consciousness: awake, alert , and oriented  Airway & Oxygen Therapy: Patient Spontanous Breathing  Post-op Assessment: Report given to RN and Post -op Vital signs reviewed and stable  Post vital signs: stable  Last Vitals:  Vitals Value Taken Time  BP    Temp    Pulse 75 11/15/23 10:20  Resp 17 11/15/23 10:20  SpO2 98 % 11/15/23 10:20  Vitals shown include unfiled device data.  Last Pain:  Vitals:   11/15/23 0801  TempSrc: Oral  PainSc:       Patients Stated Pain Goal: 3 (11/09/23 1545)  Complications: No notable events documented.

## 2023-11-15 NOTE — Anesthesia Preprocedure Evaluation (Addendum)
 Anesthesia Evaluation  Patient identified by MRN, date of birth, ID band Patient awake    Reviewed: Allergy & Precautions, NPO status , Patient's Chart, lab work & pertinent test results  History of Anesthesia Complications Negative for: history of anesthetic complications  Airway Mallampati: I   Neck ROM: Full    Dental  (+) Missing, Chipped   Pulmonary neg pulmonary ROS   Pulmonary exam normal breath sounds clear to auscultation       Cardiovascular Exercise Tolerance: Good negative cardio ROS Normal cardiovascular exam Rhythm:Regular Rate:Normal  ECG 11/06/23: Sinus tachycardia with irregular rate   Neuro/Psych negative neurological ROS     GI/Hepatic negative GI ROS,,,  Endo/Other  negative endocrine ROS    Renal/GU negative Renal ROS     Musculoskeletal   Abdominal   Peds  Hematology  (+) Blood dyscrasia, anemia   Anesthesia Other Findings   Reproductive/Obstetrics                              Anesthesia Physical Anesthesia Plan  ASA: 1  Anesthesia Plan: General   Post-op Pain Management:    Induction: Intravenous  PONV Risk Score and Plan: 2 and Ondansetron, Dexamethasone, Treatment may vary due to age or medical condition, Propofol infusion and TIVA  Airway Management Planned: Natural Airway  Additional Equipment:   Intra-op Plan:   Post-operative Plan: Extubation in OR  Informed Consent: I have reviewed the patients History and Physical, chart, labs and discussed the procedure including the risks, benefits and alternatives for the proposed anesthesia with the patient or authorized representative who has indicated his/her understanding and acceptance.       Plan Discussed with: CRNA  Anesthesia Plan Comments: (LMA/GETA backup discussed.  Patient consented for risks of anesthesia including but not limited to:  - adverse reactions to medications - damage to  eyes, teeth, lips or other oral mucosa - nerve damage due to positioning  - sore throat or hoarseness - damage to heart, brain, nerves, lungs, other parts of body or loss of life  Informed patient about role of CRNA in peri- and intra-operative care.  Patient voiced understanding.)         Anesthesia Quick Evaluation
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
## 2023-11-15 NOTE — Plan of Care (Signed)

## 2023-11-16 ENCOUNTER — Inpatient Hospital Stay

## 2023-11-16 ENCOUNTER — Other Ambulatory Visit: Payer: Self-pay

## 2023-11-16 ENCOUNTER — Encounter: Payer: Self-pay | Admitting: Vascular Surgery

## 2023-11-16 DIAGNOSIS — S8012XA Contusion of left lower leg, initial encounter: Secondary | ICD-10-CM | POA: Diagnosis not present

## 2023-11-16 MED ORDER — GERHARDT'S BUTT CREAM
TOPICAL_CREAM | CUTANEOUS | Status: DC | PRN
  Filled 2023-11-16: qty 60

## 2023-11-16 MED ORDER — ACETAMINOPHEN 325 MG PO TABS: 650.0000 mg | ORAL_TABLET | Freq: Four times a day (QID) | ORAL | Status: DC | PRN

## 2023-11-16 MED ORDER — POLYSACCHARIDE IRON COMPLEX 150 MG PO CAPS
150.0000 mg | ORAL_CAPSULE | Freq: Every day | ORAL | 2 refills | Status: DC
  Filled 2023-11-16: qty 30, 30d supply, fill #0

## 2023-11-16 MED ORDER — ASCORBIC ACID 500 MG PO TABS
500.0000 mg | ORAL_TABLET | Freq: Every day | ORAL | 2 refills | Status: DC
  Filled 2023-11-16: qty 30, 30d supply, fill #0

## 2023-11-16 MED ORDER — TRAMADOL HCL 50 MG PO TABS
50.0000 mg | ORAL_TABLET | Freq: Two times a day (BID) | ORAL | 0 refills | Status: AC | PRN
  Filled 2023-11-16: qty 15, 7d supply, fill #0

## 2023-11-16 MED ORDER — FOLIC ACID 1 MG PO TABS
1.0000 mg | ORAL_TABLET | Freq: Every day | ORAL | 2 refills | Status: DC
  Filled 2023-11-16: qty 30, 30d supply, fill #0

## 2023-11-16 MED ORDER — VITAMIN D (ERGOCALCIFEROL) 1.25 MG (50000 UNIT) PO CAPS
50000.0000 [IU] | ORAL_CAPSULE | ORAL | 0 refills | Status: DC
  Filled 2023-11-16: qty 4, 28d supply, fill #0

## 2023-11-16 MED ORDER — AMOXICILLIN-POT CLAVULANATE 875-125 MG PO TABS
1.0000 | ORAL_TABLET | Freq: Two times a day (BID) | ORAL | 0 refills | Status: AC
  Filled 2023-11-16: qty 14, 7d supply, fill #0

## 2023-11-16 MED ORDER — AMOXICILLIN-POT CLAVULANATE 875-125 MG PO TABS
1.0000 | ORAL_TABLET | Freq: Two times a day (BID) | ORAL | 0 refills | Status: DC
  Filled 2023-11-16: qty 28, 14d supply, fill #0

## 2023-11-16 NOTE — Discharge Instructions (Signed)
 [facility] - [phone]
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
## 2023-11-16 NOTE — Progress Notes (Signed)
 Progress Note    11/16/2023 9:58 AM 1 Day Post-Op  Subjective:   Andrew Clay is a 58 yo male who is POD #1 from:   PROCEDURE:11/15/2023 Irrigation and VAC dressing change left thigh     PROCEDURE:11/12/2023 Irrigation VAC dressing change left thigh     PROCEDURE: 11/09/2023 Irrigation and debridement of skin and soft tissue of the left thigh.  Amount of debridement difficult to measure but the total wound size is now about 32 cm in length and about 19 cm in width.  Placement of celebrate and placement of a negative pressure VAC dressing.   PROCEDURE: 11/07/2023 Evacuation of massive left thigh liquefied hematoma Irrigation and debridement of approximately 400 cm of skin, soft tissue, and muscle fascia Ligation of left great saphenous vein Pulse lavage irrigation Negative pressure dressing placement left lower extremity   Resting comfortably this morning. Denies any pain. No complaints overnight. Vitals all remain stable.   Vitals:   11/16/23 0747 11/16/23 0748  BP: (!) 145/98 (!) 145/98  Pulse: 74 73  Resp: 20 20  Temp: 98 F (36.7 C)   SpO2: 99% 99%   Physical Exam: Cardiac:  RRR, Normal S1, S2. No murmurs Lungs:  Non Labored Breathing, Clear on auscultation throughout, No rales, Rhonchi or wheezing to note.  Incisions:  Left Lower extremity hematoma evacuation to left inner thigh. Wound vac in place and working well.  Extremities:  Right Lower extremity warm with palpable pulses and no crush injuries. Left lower extremity remains swollen with +2 edema. Positive doppler DP/PT pulses. Warm to touch.  Abdomen:  Positive bowel sounds throughout, soft non tender and non distended.  Neurologic: AAOX1, answers questions and follows commands. Highly forgetful.   CBC    Component Value Date/Time   WBC 15.7 (H) 11/15/2023 0354   RBC 2.53 (L) 11/15/2023 0354   HGB 7.7 (L) 11/15/2023 1419   HGB 15.1 12/06/2015 1205   HCT 24.2 (L) 11/15/2023 1419   HCT 43.9 12/06/2015  1205   PLT 370 11/15/2023 0354   PLT 226 12/06/2015 1205   MCV 93.7 11/15/2023 0354   MCV 89 12/06/2015 1205   MCH 29.2 11/15/2023 0354   MCHC 31.2 11/15/2023 0354   RDW 14.0 11/15/2023 0354   RDW 13.3 12/06/2015 1205   LYMPHSABS 0.4 (L) 11/06/2023 1946   LYMPHSABS 2.2 12/06/2015 1205   MONOABS 0.6 11/06/2023 1946   EOSABS 0.0 11/06/2023 1946   EOSABS 0.1 12/06/2015 1205   BASOSABS 0.0 11/06/2023 1946   BASOSABS 0.0 12/06/2015 1205    BMET    Component Value Date/Time   NA 142 11/15/2023 0354   NA 143 11/09/2014 0851   K 4.4 11/15/2023 0354   CL 112 (H) 11/15/2023 0354   CO2 24 11/15/2023 0354   GLUCOSE 92 11/15/2023 0354   BUN 36 (H) 11/15/2023 0354   BUN 15 11/09/2014 0851   CREATININE 1.06 11/15/2023 0354   CALCIUM 8.0 (L) 11/15/2023 0354   GFRNONAA >60 11/15/2023 0354   GFRAA 113 11/09/2014 0851    INR    Component Value Date/Time   INR 1.2 11/07/2023 0449     Intake/Output Summary (Last 24 hours) at 11/16/2023 0958 Last data filed at 11/16/2023 0554 Gross per 24 hour  Intake 950 ml  Output 1600 ml  Net -650 ml     Assessment/Plan:  58 y.o. male is s/p SEE ABOVE  1 Day Post-Op   PLAN Okay per vascular surgery for patient to discharge home today with  Home Health and wound vac. Patient will need to follow up with Dr Lowery next week as scheduled. No further intervention planned by vascular surgery.     DVT prophylaxis:  Lovenox 40 mg SQ Q24    Joette Schmoker R [facility] 11/16/2023 9:58 AM

## 2023-11-16 NOTE — Progress Notes (Signed)
 Patient discharging to home. Will have [facility] [place] for PT/RN services. Discharge instructions reviewed with patient and his wife Amy. No question at this time

## 2023-11-16 NOTE — Plan of Care (Signed)

## 2023-11-19 ENCOUNTER — Telehealth: Payer: Self-pay

## 2023-11-19 NOTE — Transitions of Care (Post Inpatient/ED Visit) (Signed)
   11/19/2023  Name: Andrew Clay MRN: [id number] DOB: 04-29-1965  Today's TOC FU Call Status: Today's TOC FU Call Status:: Unsuccessful Call (1st Attempt) Unsuccessful Call (1st Attempt) Date: 11/19/23  Attempted to reach the patient regarding the most recent Inpatient/ED visit. Left a HIPAA approved voicemail message to phone number provided in demographics per DPR (DPR states no one else) left detail message for return call request.    Follow Up Plan: Additional outreach attempts will be made to reach the patient to complete the Transitions of Care (Post Inpatient/ED visit) call.   Richerd Fish, RN, BSN, CCM [facility], [facility] RN Care Manager Direct Dial: [phone]

## 2023-11-20 ENCOUNTER — Telehealth: Payer: Self-pay

## 2023-11-20 NOTE — Transitions of Care (Post Inpatient/ED Visit) (Signed)
   11/20/2023  Name: Andrew Clay MRN: [id number] DOB: [birth year]  Today's TOC FU Call Status: Today's TOC FU Call Status:: Unsuccessful Call (2nd Attempt) Unsuccessful Call (2nd Attempt) Date: 11/20/23  Attempted to reach the patient regarding the most recent Inpatient/ED visit.  Follow Up Plan: Additional outreach attempts will be made to reach the patient to complete the Transitions of Care (Post Inpatient/ED visit) call.   Richerd Fish, RN, BSN, CCM [facility], [facility] RN Care Manager Direct Dial: [phone]

## 2023-11-20 NOTE — Telephone Encounter (Signed)
 Copied from CRM [id number]. Topic: Clinical - Home Health Verbal Orders >> Nov 19, 2023  4:52 PM Delon HERO wrote: Blanch Calling from [facility] requesting skilled Nursing orders 3 W 8 for wound vac CB- [phone] Verbal Orders ok on VM

## 2023-11-20 NOTE — Telephone Encounter (Addendum)
 They need to his contact his surgeon. I have not  seen this patient for years and don't manage surgical wounds.

## 2023-11-20 NOTE — Telephone Encounter (Signed)
 Advised

## 2023-11-21 ENCOUNTER — Telehealth: Payer: Self-pay

## 2023-11-21 NOTE — Transitions of Care (Post Inpatient/ED Visit) (Signed)
   11/21/2023  Name: Andrew Clay MRN: [id number] DOB: [birth year]  Today's TOC FU Call Status: Today's TOC FU Call Status:: Unsuccessful Call (3rd Attempt) Unsuccessful Call (3rd Attempt) Date: 11/21/23  Attempted to reach the patient regarding the most recent Inpatient/ED visit. Left a HIPAA approved voicemail message to phone number provided in demographics per DPR.    Follow Up Plan: No further outreach attempts will be made at this time. We have been unable to contact the patient.  Richerd Fish, RN, BSN, CCM [facility], [facility] RN Care Manager Direct Dial: [phone]

## 2023-11-23 ENCOUNTER — Encounter: Payer: Self-pay | Admitting: Plastic Surgery

## 2023-11-23 ENCOUNTER — Ambulatory Visit: Admitting: Plastic Surgery

## 2023-11-23 VITALS — BP 161/92 | HR 81 | Ht 72.0 in | Wt 210.0 lb

## 2023-11-23 DIAGNOSIS — W01198D Fall on same level from slipping, tripping and stumbling with subsequent striking against other object, subsequent encounter: Secondary | ICD-10-CM

## 2023-11-23 DIAGNOSIS — S8012XA Contusion of left lower leg, initial encounter: Secondary | ICD-10-CM

## 2023-11-23 DIAGNOSIS — S8012XD Contusion of left lower leg, subsequent encounter: Secondary | ICD-10-CM | POA: Diagnosis not present

## 2023-11-23 DIAGNOSIS — Z719 Counseling, unspecified: Secondary | ICD-10-CM
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
## 2023-11-26 ENCOUNTER — Encounter (HOSPITAL_COMMUNITY): Payer: Self-pay | Admitting: Plastic Surgery

## 2023-11-26 ENCOUNTER — Other Ambulatory Visit: Payer: Self-pay

## 2023-11-26 NOTE — Progress Notes (Addendum)
 PCP - Gasper Nancyann BRAVO, MD  Cardiologist -   PPM/ICD - denies Device Orders - n/a Rep Notified - n/a  Chest x-ray - denies EKG - 11-06-23 Stress Test - denies ECHO - denies Cardiac Cath - denies  CPAP - denies  Dm - denies  Blood Thinner Instructions: denies Aspirin Instructions: denies  ERAS Protcol - NPO  COVID TEST- n/a  Anesthesia review: yes, recent hospital stay, no follow up noted with PCP and Hemoglobin 7.7 on [id number]  Patient verbally denies any shortness of breath, fever, cough and chest pain during phone call   -------------  SDW INSTRUCTIONS given:  Your procedure is scheduled on October 22,2025.  Report to [facility] Main Entrance A at 10:45 A.M., and check in at the Admitting office.  Call this number if you have problems the morning of surgery:  [phone]   Remember:  Do not eat or drink after midnight the night before your surgery    Take these medicines the morning of surgery with A SIP OF WATER  acetaminophen (TYLENOL)    As of today, STOP taking any Aspirin (unless otherwise instructed by your surgeon) Aleve , Naproxen , Ibuprofen, Motrin, Advil, Goody's, BC's, all herbal medications, fish oil, and all vitamins.                      Do not wear jewelry, make up, or nail polish            Do not wear lotions, powders, perfumes/colognes, or deodorant.            Do not shave 48 hours prior to surgery.  Men may shave face and neck.            Do not bring valuables to the hospital.            [facility] is not responsible for any belongings or valuables.  Do NOT Smoke (Tobacco/Vaping) 24 hours prior to your procedure If you use a CPAP at night, you may bring all equipment for your overnight stay.   Contacts, glasses, dentures or bridgework may not be worn into surgery.      For patients admitted to the hospital, discharge time will be determined by your treatment team.   Patients discharged the day of surgery will not be allowed to drive  home, and someone needs to stay with them for 24 hours.    Special instructions:   [facility]- Preparing For Surgery  Before surgery, you can play an important role. Because skin is not sterile, your skin needs to be as free of germs as possible. You can reduce the number of germs on your skin by washing with CHG (chlorahexidine gluconate) Soap before surgery.  CHG is an antiseptic cleaner which kills germs and bonds with the skin to continue killing germs even after washing.    Oral Hygiene is also important to reduce your risk of infection.  Remember - BRUSH YOUR TEETH THE MORNING OF SURGERY WITH YOUR REGULAR TOOTHPASTE  Please do not use if you have an allergy to CHG or antibacterial soaps. If your skin becomes reddened/irritated stop using the CHG.  Do not shave (including legs and underarms) for at least 48 hours prior to first CHG shower. It is OK to shave your face.  Please follow these instructions carefully.   Shower the NIGHT BEFORE SURGERY and the MORNING OF SURGERY with DIAL Soap.   Pat yourself dry with a CLEAN TOWEL.  Wear CLEAN PAJAMAS  to bed the night before surgery  Place CLEAN SHEETS on your bed the night of your first shower and DO NOT SLEEP WITH PETS.   Day of Surgery: Please shower morning of surgery  Wear Clean/Comfortable clothing the morning of surgery Do not apply any deodorants/lotions.   Remember to brush your teeth WITH YOUR REGULAR TOOTHPASTE.   Questions were answered. Patient verbalized understanding of instructions.

## 2023-11-27 ENCOUNTER — Telehealth: Payer: Self-pay | Admitting: Plastic Surgery

## 2023-11-27 NOTE — Telephone Encounter (Signed)
 Needs to R/S Surgery

## 2023-11-27 NOTE — Progress Notes (Signed)
 Dr. Lowery aware of lab result requested H and H in am prior to procedure

## 2023-11-28 ENCOUNTER — Ambulatory Visit (HOSPITAL_COMMUNITY): Admitting: Anesthesiology

## 2023-11-28 ENCOUNTER — Encounter (HOSPITAL_COMMUNITY)
Admission: RE | Disposition: A | Discharge status: Home / Self Care | Payer: Self-pay | Source: Home / Self Care | Attending: Plastic Surgery

## 2023-11-28 ENCOUNTER — Other Ambulatory Visit: Payer: Self-pay

## 2023-11-28 ENCOUNTER — Encounter (HOSPITAL_COMMUNITY): Payer: Self-pay | Admitting: Plastic Surgery

## 2023-11-28 ENCOUNTER — Observation Stay (HOSPITAL_COMMUNITY)
Admission: RE | Admit: 2023-11-28 | Discharge: 2023-11-29 | Disposition: A | Discharge status: Home / Self Care | Attending: Plastic Surgery | Admitting: Plastic Surgery

## 2023-11-28 DIAGNOSIS — Z01818 Encounter for other preprocedural examination: Principal | ICD-10-CM

## 2023-11-28 DIAGNOSIS — S81802A Unspecified open wound, left lower leg, initial encounter: Principal | ICD-10-CM | POA: Insufficient documentation

## 2023-11-28 DIAGNOSIS — W03XXXA Other fall on same level due to collision with another person, initial encounter: Secondary | ICD-10-CM | POA: Diagnosis not present

## 2023-11-28 DIAGNOSIS — S81802D Unspecified open wound, left lower leg, subsequent encounter: Secondary | ICD-10-CM | POA: Diagnosis not present

## 2023-11-28 DIAGNOSIS — S71102A Unspecified open wound, left thigh, initial encounter: Secondary | ICD-10-CM | POA: Diagnosis not present

## 2023-11-28 HISTORY — PX: INCISION AND DRAINAGE OF WOUND: SHX1803

## 2023-11-28 HISTORY — DX: Anemia, unspecified: D64.9

## 2023-11-28 HISTORY — PX: APPLICATION OF WOUND VAC: SHX5189

## 2023-11-28 LAB — POCT I-STAT, CHEM 8
BUN: 18 mg/dL (ref 6–20)
Calcium, Ion: 1.09 mmol/L — ABNORMAL LOW (ref 1.15–1.40)
Chloride: 109 mmol/L (ref 98–111)
Creatinine, Ser: 0.8 mg/dL (ref 0.61–1.24)
Glucose, Bld: 92 mg/dL (ref 70–99)
HCT: 24 % — ABNORMAL LOW (ref 39.0–52.0)
Hemoglobin: 8.2 g/dL — ABNORMAL LOW (ref 13.0–17.0)
Potassium: 3.9 mmol/L (ref 3.5–5.1)
Sodium: 141 mmol/L (ref 135–145)
TCO2: 20 mmol/L — ABNORMAL LOW (ref 22–32)

## 2023-11-28 LAB — TYPE AND SCREEN
ABO/RH(D): O NEG
Antibody Screen: NEGATIVE

## 2023-11-28 SURGERY — IRRIGATION AND DEBRIDEMENT WOUND
Anesthesia: General | Site: Leg Upper | Laterality: Left

## 2023-11-28 MED ORDER — KCL IN DEXTROSE-NACL 20-5-0.45 MEQ/L-%-% IV SOLN
INTRAVENOUS | Status: DC
  Filled 2023-11-28: qty 1000

## 2023-11-28 MED ORDER — ONDANSETRON HCL 4 MG/2ML IJ SOLN
INTRAMUSCULAR | Status: DC | PRN
  Administered 2023-11-28: 4 mg via INTRAVENOUS

## 2023-11-28 MED ORDER — MIDAZOLAM HCL (PF) 2 MG/2ML IJ SOLN
INTRAMUSCULAR | Status: DC | PRN
  Administered 2023-11-28: 2 mg via INTRAVENOUS

## 2023-11-28 MED ORDER — POLYETHYLENE GLYCOL 3350 17 G PO PACK
17.0000 g | PACK | Freq: Every day | ORAL | Status: DC | PRN
Start: 2023-11-28 — End: 2023-11-29

## 2023-11-28 MED ORDER — ACETAMINOPHEN 10 MG/ML IV SOLN: 1000.0000 mg | Freq: Once | INTRAVENOUS | Status: DC | PRN

## 2023-11-28 MED ORDER — HYDROMORPHONE HCL 1 MG/ML IJ SOLN: 1.0000 mg | INTRAMUSCULAR | Status: DC | PRN

## 2023-11-28 MED ORDER — LACTATED RINGERS IV SOLN: INTRAVENOUS | Status: DC

## 2023-11-28 MED ORDER — SENNA 8.6 MG PO TABS
1.0000 | ORAL_TABLET | Freq: Two times a day (BID) | ORAL | Status: DC
Start: 2023-11-28 — End: 2023-11-29

## 2023-11-28 MED ORDER — DEXAMETHASONE SOD PHOSPHATE PF 10 MG/ML IJ SOLN
INTRAMUSCULAR | Status: DC | PRN
  Administered 2023-11-28: 10 mg via INTRAVENOUS

## 2023-11-28 MED ORDER — LIDOCAINE-EPINEPHRINE 1 %-1:100000 IJ SOLN
INTRAMUSCULAR | Status: AC
  Filled 2023-11-28: qty 1

## 2023-11-28 MED ORDER — HYDROMORPHONE HCL 1 MG/ML IJ SOLN: 0.2500 mg | INTRAMUSCULAR | Status: DC | PRN

## 2023-11-28 MED ORDER — MIDAZOLAM HCL 2 MG/2ML IJ SOLN
INTRAMUSCULAR | Status: AC
  Filled 2023-11-28: qty 2

## 2023-11-28 MED ORDER — PROPOFOL 10 MG/ML IV BOLUS
INTRAVENOUS | Status: DC | PRN
  Administered 2023-11-28: 200 mg via INTRAVENOUS

## 2023-11-28 MED ORDER — ONDANSETRON HCL 4 MG/2ML IJ SOLN: 4.0000 mg | Freq: Four times a day (QID) | INTRAMUSCULAR | Status: DC | PRN

## 2023-11-28 MED ORDER — CEFAZOLIN SODIUM-DEXTROSE 2-4 GM/100ML-% IV SOLN
2.0000 g | INTRAVENOUS | Status: AC
  Administered 2023-11-28: 2 g via INTRAVENOUS
  Filled 2023-11-28: qty 100

## 2023-11-28 MED ORDER — BISACODYL 10 MG RE SUPP: 10.0000 mg | Freq: Every day | RECTAL | Status: DC | PRN

## 2023-11-28 MED ORDER — 0.9 % SODIUM CHLORIDE (POUR BTL) OPTIME
TOPICAL | Status: DC | PRN
  Administered 2023-11-28: 1000 mL

## 2023-11-28 MED ORDER — KETOROLAC TROMETHAMINE 30 MG/ML IJ SOLN
INTRAMUSCULAR | Status: DC | PRN
  Administered 2023-11-28: 30 mg via INTRAVENOUS

## 2023-11-28 MED ORDER — ONDANSETRON 4 MG PO TBDP: 4.0000 mg | ORAL_TABLET | Freq: Four times a day (QID) | ORAL | Status: DC | PRN

## 2023-11-28 MED ORDER — IBUPROFEN 400 MG PO TABS: 400.0000 mg | ORAL_TABLET | Freq: Four times a day (QID) | ORAL | Status: DC

## 2023-11-28 MED ORDER — LIDOCAINE 2% (20 MG/ML) 5 ML SYRINGE
INTRAMUSCULAR | Status: DC | PRN
  Administered 2023-11-28: 40 mg via INTRAVENOUS

## 2023-11-28 MED ORDER — ACETAMINOPHEN 325 MG PO TABS
325.0000 mg | ORAL_TABLET | Freq: Four times a day (QID) | ORAL | Status: DC
Start: 2023-11-28 — End: 2023-11-29

## 2023-11-28 MED ORDER — MEPERIDINE HCL 25 MG/ML IJ SOLN: 6.2500 mg | INTRAMUSCULAR | Status: DC | PRN

## 2023-11-28 MED ORDER — OXYCODONE HCL 5 MG PO TABS: 5.0000 mg | ORAL_TABLET | ORAL | Status: DC | PRN

## 2023-11-28 MED ORDER — OXYCODONE HCL 5 MG/5ML PO SOLN: 5.0000 mg | Freq: Once | ORAL | Status: DC | PRN

## 2023-11-28 MED ORDER — ACETAMINOPHEN 10 MG/ML IV SOLN
INTRAVENOUS | Status: DC | PRN
  Administered 2023-11-28: 1000 mg via INTRAVENOUS

## 2023-11-28 MED ORDER — FENTANYL CITRATE (PF) 250 MCG/5ML IJ SOLN
INTRAMUSCULAR | Status: DC | PRN
  Administered 2023-11-28 (×2): 50 ug via INTRAVENOUS

## 2023-11-28 MED ORDER — ACETAMINOPHEN 10 MG/ML IV SOLN
INTRAVENOUS | Status: AC
Start: 2023-11-28 — End: 2023-11-28
  Filled 2023-11-28: qty 100

## 2023-11-28 MED ORDER — ORAL CARE MOUTH RINSE: 15.0000 mL | Freq: Once | OROMUCOSAL | Status: AC

## 2023-11-28 MED ORDER — PROPOFOL 10 MG/ML IV BOLUS
INTRAVENOUS | Status: AC
  Filled 2023-11-28: qty 20

## 2023-11-28 MED ORDER — DROPERIDOL 2.5 MG/ML IJ SOLN: 0.6250 mg | Freq: Once | INTRAMUSCULAR | Status: DC | PRN

## 2023-11-28 MED ORDER — CHLORHEXIDINE GLUCONATE 0.12 % MT SOLN
15.0000 mL | Freq: Once | OROMUCOSAL | Status: AC
  Administered 2023-11-28: 15 mL via OROMUCOSAL
  Filled 2023-11-28: qty 15

## 2023-11-28 MED ORDER — CEFAZOLIN SODIUM-DEXTROSE 2-4 GM/100ML-% IV SOLN
2.0000 g | Freq: Three times a day (TID) | INTRAVENOUS | Status: DC
  Administered 2023-11-28 – 2023-11-29 (×2): 2 g via INTRAVENOUS
  Filled 2023-11-28 (×2): qty 100

## 2023-11-28 MED ORDER — CHLORHEXIDINE GLUCONATE CLOTH 2 % EX PADS: 6.0000 | MEDICATED_PAD | Freq: Once | CUTANEOUS | Status: DC

## 2023-11-28 MED ORDER — FENTANYL CITRATE (PF) 100 MCG/2ML IJ SOLN
INTRAMUSCULAR | Status: AC
  Filled 2023-11-28: qty 2

## 2023-11-28 MED ORDER — OXYCODONE HCL 5 MG PO TABS: 5.0000 mg | ORAL_TABLET | Freq: Once | ORAL | Status: DC | PRN

## 2023-11-28 SURGICAL SUPPLY — 50 items
APPLICATOR COTTON TIP 6 STRL (MISCELLANEOUS) IMPLANT
BAG COUNTER SPONGE SURGICOUNT (BAG) ×1 IMPLANT
BAG DECANTER FOR FLEXI CONT (MISCELLANEOUS) IMPLANT
BENZOIN TINCTURE PRP APPL 2/3 (GAUZE/BANDAGES/DRESSINGS) ×1 IMPLANT
CANISTER SUCTION 3000ML PPV (SUCTIONS) ×1 IMPLANT
CANISTER WOUNDNEG PRESSURE 500 (CANNISTER) IMPLANT
CNTNR URN SCR LID CUP LEK RST (MISCELLANEOUS) IMPLANT
COVER SURGICAL LIGHT HANDLE (MISCELLANEOUS) ×1 IMPLANT
DRAIN CHANNEL 19F RND (DRAIN) IMPLANT
DRAIN WOUND RND W/TROCAR (DRAIN) IMPLANT
DRAPE HALF SHEET 40X57 (DRAPES) IMPLANT
DRAPE IMP U-DRAPE 54X76 (DRAPES) ×1 IMPLANT
DRAPE INCISE IOBAN 66X45 STRL (DRAPES) IMPLANT
DRAPE LAPAROSCOPIC ABDOMINAL (DRAPES) IMPLANT
DRAPE LAPAROTOMY 100X72 PEDS (DRAPES) ×1 IMPLANT
DRSG ADAPTIC 3X8 NADH LF (GAUZE/BANDAGES/DRESSINGS) IMPLANT
DRSG CUTIMED SORBACT 7X9 (GAUZE/BANDAGES/DRESSINGS) IMPLANT
DRSG VAC GRANUFOAM LG (GAUZE/BANDAGES/DRESSINGS) IMPLANT
DRSG VAC GRANUFOAM MED (GAUZE/BANDAGES/DRESSINGS) IMPLANT
DRSG VAC GRANUFOAM SM (GAUZE/BANDAGES/DRESSINGS) IMPLANT
ELECT CAUTERY BLADE 6.4 (BLADE) IMPLANT
ELECTRODE REM PT RTRN 9FT ADLT (ELECTROSURGICAL) ×1 IMPLANT
GAUZE PAD ABD 8X10 STRL (GAUZE/BANDAGES/DRESSINGS) IMPLANT
GAUZE SPONGE 4X4 12PLY STRL (GAUZE/BANDAGES/DRESSINGS) IMPLANT
GLOVE BIO SURGEON STRL SZ 6.5 (GLOVE) ×1 IMPLANT
GLOVE BIOGEL M 6.5 STRL (GLOVE) ×1 IMPLANT
GOWN STRL REUS W/ TWL LRG LVL3 (GOWN DISPOSABLE) ×3 IMPLANT
GRAFT MYRIAD 20X20 (Graft) IMPLANT
KIT BASIN OR (CUSTOM PROCEDURE TRAY) ×1 IMPLANT
KIT TURNOVER KIT B (KITS) ×1 IMPLANT
NDL HYPO 25GX1X1/2 BEV (NEEDLE) ×1 IMPLANT
NEEDLE HYPO 25GX1X1/2 BEV (NEEDLE) ×1 IMPLANT
PACK GENERAL/GYN (CUSTOM PROCEDURE TRAY) ×1 IMPLANT
PACK UNIVERSAL I (CUSTOM PROCEDURE TRAY) ×1 IMPLANT
PAD ARMBOARD POSITIONER FOAM (MISCELLANEOUS) ×2 IMPLANT
POWDER MORCELSS FINE 2000MG (Miscellaneous) IMPLANT
SOLN 0.9% NACL POUR BTL 1000ML (IV SOLUTION) ×1 IMPLANT
STAPLER SKIN PROX 35W (STAPLE) ×1 IMPLANT
SURGILUBE 2OZ TUBE FLIPTOP (MISCELLANEOUS) IMPLANT
SUT MNCRL AB 3-0 PS2 27 (SUTURE) IMPLANT
SUT MNCRL AB 4-0 PS2 18 (SUTURE) IMPLANT
SUT MON AB 2-0 CT1 36 (SUTURE) IMPLANT
SUT MON AB 5-0 PS2 18 (SUTURE) IMPLANT
SUT VIC AB 4-0 PS2 18 (SUTURE) IMPLANT
SUT VIC AB 4-0 PS2 27 (SUTURE) IMPLANT
SWAB COLLECTION DEVICE MRSA (MISCELLANEOUS) IMPLANT
SWAB CULTURE ESWAB REG 1ML (MISCELLANEOUS) IMPLANT
SYR CONTROL 10ML LL (SYRINGE) ×1 IMPLANT
TOWEL GREEN STERILE (TOWEL DISPOSABLE) ×1 IMPLANT
UNDERPAD 30X36 HEAVY ABSORB (UNDERPADS AND DIAPERS) ×1 IMPLANT

## 2023-11-28 NOTE — Anesthesia Preprocedure Evaluation (Addendum)
 Anesthesia Evaluation  Patient identified by MRN, date of birth, ID band Patient awake    Reviewed: Allergy & Precautions, NPO status , Patient's Chart, lab work & pertinent test results  Airway Mallampati: I  TM Distance: >3 FB Neck ROM: Full    Dental  (+) Teeth Intact, Dental Advisory Given, Chipped,    Pulmonary neg pulmonary ROS   breath sounds clear to auscultation       Cardiovascular negative cardio ROS  Rhythm:Regular Rate:Normal     Neuro/Psych   Anxiety     negative neurological ROS     GI/Hepatic negative GI ROS, Neg liver ROS,,,  Endo/Other  negative endocrine ROS    Renal/GU negative Renal ROS     Musculoskeletal negative musculoskeletal ROS (+)    Abdominal   Peds  Hematology  (+) Blood dyscrasia, anemia   Anesthesia Other Findings   Reproductive/Obstetrics                              Anesthesia Physical Anesthesia Plan  ASA: 2  Anesthesia Plan: General   Post-op Pain Management: Tylenol PO (pre-op)* and Toradol IV (intra-op)*   Induction: Intravenous  PONV Risk Score and Plan: 3 and Ondansetron, Dexamethasone and Midazolam  Airway Management Planned: LMA  Additional Equipment: None  Intra-op Plan:   Post-operative Plan: Extubation in OR  Informed Consent: I have reviewed the patients History and Physical, chart, labs and discussed the procedure including the risks, benefits and alternatives for the proposed anesthesia with the patient or authorized representative who has indicated his/her understanding and acceptance.     Dental advisory given  Plan Discussed with: CRNA  Anesthesia Plan Comments:          Anesthesia Quick Evaluation

## 2023-11-28 NOTE — Transfer of Care (Signed)
 Immediate Anesthesia Transfer of Care Note  Patient: Andrew Clay  Procedure(s) Performed: IRRIGATION AND DEBRIDEMENT WOUND right leg debridement with myriad and vac placement (Left: Leg Upper) APPLICATION, WOUND VAC (Left: Leg Upper)  Patient Location: PACU  Anesthesia Type:General  Level of Consciousness: awake and alert   Airway & Oxygen Therapy: Patient Spontanous Breathing  Post-op Assessment: Report given to RN and Post -op Vital signs reviewed and stable  Post vital signs: Reviewed and stable  Last Vitals:  Vitals Value Taken Time  BP 125/83 11/28/23 14:09  Temp    Pulse 69 11/28/23 14:10  Resp 14 11/28/23 14:10  SpO2 94 % 11/28/23 14:10  Vitals shown include unfiled device data.  Last Pain:  Vitals:   11/28/23 1122  TempSrc:   PainSc: 0-No pain         Complications: No notable events documented.

## 2023-11-28 NOTE — Discharge Instructions (Signed)
Wound Care   Guide to Wound Care  Proper wound care may reduce the risk of infection, improve healing rates, and limit scarring.  This is a general guide to help care for and manage wounds treated with product wound matrix.   Dressing Changes The frequency of dressing changes can vary based on which product was applied, the size of the wound, or the amount of wound drainage. Dressing inspections are recommended, at least weekly.   If you have a Wound VAC it will be changed in one week after the first time it is applied.  Then it will be changed once or twice a week.   If you don't have a Wound VAC, then place KY gel on the wound daily and cover with gauze.  Dressing Types Primary Dressing:  Non-adherent dressing goes directly over wounds being treated with the powder or sheet.  Secondary Dressing:  Secures the primary dressing in place and provides extra protection, compression, and absorption.  1. Wash Hands - To help decrease the risk of infection, caregivers should wash their hands for a minimum of 20 seconds and may use medical gloves.   2. Remove the Dressings - Avoid removing product from the wound by carefully removing the applicable dressing(s) at the time points recommended above, or as recommended by the treating physician.  Expected Color and Odor:  It is entirely normal for the wound to have an unpleasant odor and to form a caramel-colored gel as the product absorbs into the wound. It is important to leave this gel on the wound site.  3. Clean the Wound - Use clean water or saline to gently rinse around the wound surface and remove any excess discharge that may be present on the wound. Do not wipe off any of the caramel-colored gel on the wound.   What to look out for:  Large or increased amount of drainage   Surrounding skin has worsening redness or hot to touch   Increased pain in or around the wound   Flu-like symptoms, fatigue, decreased appetite, fever   Hard, crusty wound  surface with black or brown coloring  4. Apply New Dressings - Dressings should cover the entire wound and be suitable for maintaining a moist wound environment.  The non-adherent mesh dressing should be left in place.  New dressing should consist of KY Jelly to keep the wound moist and soft gauze secured with a wrap or tape.   Maintain a Hydrated Wound Area It is important to keep the wound area moist throughout the healing process. If the wound appears to be dry during dressing changes, select a dressing that will hydrate the wound and maintain that ideal moist environment. If you are unsure what to do, ask the treating physician.  Remodeling Process Every patient heals differently, and no two cases are the same. The size and location of the wound, product type and layering configurations, and general patient health all contribute to how quickly a wound will heal.  While many factors can influence the rate at which the product absorbs, the following can be used as a general guide.   THINGS TO DO: Refrain from smoking High protein diet with plenty of vegetables and some fruit  Limit simple processed carbohydrates and sugar Protect the wound from trauma Protect the dressing  powder       Sheet            Sorbact dressing  

## 2023-11-28 NOTE — Anesthesia Procedure Notes (Signed)
 Procedure Name: LMA Insertion Date/Time: 11/28/2023 1:25 PM  Performed by: Julien Manus, CRNAPre-anesthesia Checklist: Patient identified, Emergency Drugs available, Suction available and Patient being monitored Patient Re-evaluated:Patient Re-evaluated prior to induction Oxygen Delivery Method: Circle System Utilized Preoxygenation: Pre-oxygenation with 100% oxygen Induction Type: IV induction Ventilation: Mask ventilation without difficulty LMA: LMA inserted LMA Size: 4.0 Number of attempts: 1 Airway Equipment and Method: Bite block Placement Confirmation: positive ETCO2 Tube secured with: Tape Dental Injury: Teeth and Oropharynx as per pre-operative assessment

## 2023-11-28 NOTE — Progress Notes (Signed)
 Pt does not have anyone to pick him up after procedure. Will stay overnight per dr. Dr. Lowery.

## 2023-11-28 NOTE — Interval H&P Note (Signed)
 History and Physical Interval Note:  11/28/2023 12:51 PM  Andrew Clay  has presented today for surgery, with the diagnosis of left leg wound.  The various methods of treatment have been discussed with the patient and family. After consideration of risks, benefits and other options for treatment, the patient has consented to  Procedure(s) with comments: IRRIGATION AND DEBRIDEMENT WOUND (Left) - right leg debridement with myriad and vac placement APPLICATION, WOUND VAC (Left) as a surgical intervention.  The patient's history has been reviewed, patient examined, no change in status, stable for surgery.  I have reviewed the patient's chart and labs.  Questions were answered to the patient's satisfaction.     Estefana RAMAN Larisha Vencill

## 2023-11-28 NOTE — Op Note (Signed)
 DATE OF OPERATION: 11/28/2023  LOCATION: [name] Operating Room  PREOPERATIVE DIAGNOSIS: left thigh wound  POSTOPERATIVE DIAGNOSIS: Same  PROCEDURE:  Excision of left thigh wound soft tissue 16 x 26 x 1 cm Placement of Myriad powder 1 gm and sheet 20 x 20 cm Placement of VAC  SURGEON: Estefana Fritter, DO  ASSISTANT: Materials engineer, PA  EBL: 5 cc  CONDITION: Stable  COMPLICATIONS: None  INDICATION: The patient, Andrew Clay, is a 58 y.o. male born on [birth year], is here for treatment of a large traumatic wound of left thigh.  He has undergone several debridements in the past and presents for further care.   PROCEDURE DETAILS:  The patient was seen prior to surgery and marked.  The IV antibiotics were given. The patient was taken to the operating room and given a general anesthetic. A standard time out was performed and all information was confirmed by those in the room.  The left leg was prepped and draped.  The leg wound 16 x 26 cm was excised with the curette and #10 blade.  The wound was irrigated with Vashe.  All of the myriad powder and sheet were applied and secured with the 4-0 Vicryl.  The sorbact was applied and the VAC sponge with KY gel.  There was an excellent seal.   The patient was allowed to wake up and taken to recovery room in stable condition at the end of the case. The family was notified at the end of the case.   The advanced practice practitioner (APP) assisted throughout the case.  The APP was essential in retraction and counter traction when needed to make the case progress smoothly.  This retraction and assistance made it possible to see the tissue plans for the procedure.  The assistance was needed for blood control, tissue re-approximation and assisted with closure of the incision site.

## 2023-11-28 NOTE — TOC CM/SW Note (Addendum)
 Patient active with [facility] , confined with [place] with [facility] . Patient already has a home VAC . [facility] will need updated orders , PA aware

## 2023-11-28 NOTE — Plan of Care (Signed)
 Patient came up from PACU. No complaints of pain. Aox4. Needs attended. Call bell within reached. Refused bed alarm. Skin assessment done with 2nd RN.  Problem: Education: Goal: Knowledge of General Education information will improve Description: Including pain rating scale, medication(s)/side effects and non-pharmacologic comfort measures Outcome: Progressing   Problem: Health Behavior/Discharge Planning: Goal: Ability to manage health-related needs will improve Outcome: Progressing   Problem: Clinical Measurements: Goal: Ability to maintain clinical measurements within normal limits will improve Outcome: Progressing Goal: Will remain free from infection Outcome: Progressing Goal: Diagnostic test results will improve Outcome: Progressing Goal: Respiratory complications will improve Outcome: Progressing Goal: Cardiovascular complication will be avoided Outcome: Progressing   Problem: Activity: Goal: Risk for activity intolerance will decrease Outcome: Progressing   Problem: Nutrition: Goal: Adequate nutrition will be maintained Outcome: Progressing   Problem: Coping: Goal: Level of anxiety will decrease Outcome: Progressing   Problem: Elimination: Goal: Will not experience complications related to bowel motility Outcome: Progressing Goal: Will not experience complications related to urinary retention Outcome: Progressing   Problem: Pain Managment: Goal: General experience of comfort will improve and/or be controlled Outcome: Progressing   Problem: Safety: Goal: Ability to remain free from injury will improve Outcome: Progressing   Problem: Skin Integrity: Goal: Risk for impaired skin integrity will decrease Outcome: Progressing

## 2023-11-29 ENCOUNTER — Telehealth: Payer: Self-pay | Admitting: Student

## 2023-11-29 ENCOUNTER — Encounter (HOSPITAL_COMMUNITY): Payer: Self-pay | Admitting: Plastic Surgery

## 2023-11-29 DIAGNOSIS — S81802A Unspecified open wound, left lower leg, initial encounter: Secondary | ICD-10-CM | POA: Diagnosis not present

## 2023-11-29 NOTE — Anesthesia Postprocedure Evaluation (Signed)
 Anesthesia Post Note  Patient: Andrew Clay  Procedure(s) Performed: IRRIGATION AND DEBRIDEMENT WOUND right leg debridement with myriad and vac placement (Left: Leg Upper) APPLICATION, WOUND VAC (Left: Leg Upper)     Patient location during evaluation: PACU Anesthesia Type: General Level of consciousness: awake and alert Pain management: pain level controlled Vital Signs Assessment: post-procedure vital signs reviewed and stable Respiratory status: spontaneous breathing, nonlabored ventilation, respiratory function stable and patient connected to nasal cannula oxygen Cardiovascular status: blood pressure returned to baseline and stable Postop Assessment: no apparent nausea or vomiting Anesthetic complications: no   No notable events documented.              Lelon Ikard D Leandria Thier

## 2023-11-29 NOTE — Plan of Care (Signed)

## 2023-11-29 NOTE — Telephone Encounter (Signed)
 please call patient and schedule him an appointment the week of November 3rd for a wound vac change. Please make appointment 45 minutes , per Estefana Peck  Called lvmail for pt to call the office

## 2023-11-29 NOTE — Discharge Summary (Signed)
 Physician Discharge Summary  Patient ID: Andrew Clay MRN: [id number] DOB/AGE: 60-Dec-1967 60 y.o.  Admit date: 11/28/2023 Discharge date: 11/29/2023  Admission Diagnoses: Left thigh wound  Discharge Diagnoses:  Principal Problem:   Leg wound, left Active Problems:   Leg wound, left, initial encounter   Discharged Condition: good  Hospital Course: Patient presented to [name], OR yesterday for scheduled surgery.  He underwent excision of left thigh wound with placement of myriad and wound VAC with Dr. Lowery.  Patient is postop day 1.  Upon entering the room today, patient is sitting upright in bed in no acute distress.  He states that he is feeling well and denies any complaints from surgery yesterday.  He states he has not had any pain to his left leg and feels that the swelling has improved.  He denies any acute events overnight.  He reports that he is ready to go home.  Consults: None  Significant Diagnostic Studies: N/A  Treatments: surgery: Excision of left thigh wound with placement of myriad and wound VAC  Discharge Exam: Blood pressure (!) 150/89, pulse 77, temperature 98 F (36.7 C), temperature source Oral, resp. rate 18, height 6' (1.829 m), weight 95.3 kg, SpO2 98%. General appearance: alert, cooperative, and no distress Resp: Unlabored breathing, no respiratory distress Extremities: Wound VAC is in place to the left lower extremity, adequate suction is noted at 125 mmHg.  There is no surrounding erythema or crepitus noted.  No signs of infection.  Disposition:  Discharge disposition: 01-Home or Self Care     Patient to be discharged home.  He states that home health has been coming out and changing his wound VAC.  Plan will be for home health to begin first wound VAC change in about a week, and then change 3 times a week.  Patient will come to our office in about 2 weeks.  I discussed with the patient that the green Sorbact should not be removed when  wound VAC is being changed.  He expressed understanding to this.  Discharge Instructions     (HEART FAILURE PATIENTS) Call MD:  Anytime you have any of the following symptoms: 1) 3 pound weight gain in 24 hours or 5 pounds in 1 week 2) shortness of breath, with or without a dry hacking cough 3) swelling in the hands, feet or stomach 4) if you have to sleep on extra pillows at night in order to breathe.   Complete by: As directed    Call MD for:  difficulty breathing, headache or visual disturbances   Complete by: As directed    Call MD for:  extreme fatigue   Complete by: As directed    Call MD for:  hives   Complete by: As directed    Call MD for:  persistant dizziness or light-headedness   Complete by: As directed    Call MD for:  persistant nausea and vomiting   Complete by: As directed    Call MD for:  redness, tenderness, or signs of infection (pain, swelling, redness, odor or green/yellow discharge around incision site)   Complete by: As directed    Call MD for:  severe uncontrolled pain   Complete by: As directed    Call MD for:  temperature >100.4   Complete by: As directed    Diet - low sodium heart healthy   Complete by: As directed    Home Health   Complete by: As directed    [facility] RN with [facility]  LLE: First wound vac change should take place on 12/05/23. Remove wound vac drapes and black sponge. DO NOT REMOVE GREEN MESH DRESSING (Sorbact). Apply KY jelly to the sorbact. Apply black sponge and wound vac drapes. Set wound vac to 125 mmHg. Change MWF.   To provide the following care/treatments: RN   Increase activity slowly   Complete by: As directed       Allergies as of 11/29/2023   No Known Allergies      Medication List     TAKE these medications    acetaminophen 325 MG tablet Commonly known as: TYLENOL Take 2 tablets (650 mg total) by mouth every 6 (six) hours as needed for mild pain (pain score 1-3), fever, headache or moderate pain (pain score 4-6) (or  Fever >/= 101).   ascorbic acid 500 MG tablet Commonly known as: VITAMIN C Take 1 tablet (500 mg total) by mouth daily.   Ferrex 150 150 MG capsule Generic drug: iron polysaccharides Take 1 capsule (150 mg total) by mouth daily.   folic acid 1 MG tablet Commonly known as: FOLVITE Take 1 tablet (1 mg total) by mouth daily.   Vitamin D (Ergocalciferol) 1.25 MG (50000 UNIT) Caps capsule Commonly known as: DRISDOL Take 1 capsule (50,000 Units total) by mouth every 7 (seven) days.        Follow-up Information     Dillingham, Estefana RAMAN, DO Follow up in 10 day(s).   Specialty: Plastic Surgery Contact information: [address] [place] [address] [phone]                 [facility] [address] [place], [place] [address] [phone]  Signed: [name] 11/29/2023, 7:59 AM

## 2023-11-29 NOTE — Progress Notes (Signed)
 Removed IV-CDI. Reviewed d/c paperwork with patient and answered questions. Wheeled patient and belongings including would vac to main entrance.

## 2023-11-29 NOTE — TOC CM/SW Note (Signed)
 Patient has home VAC and HHRN with [facility]. [facility] aware of discharge today

## 2023-11-29 NOTE — Plan of Care (Signed)
  Problem: Education: Goal: Knowledge of General Education information will improve Description: Including pain rating scale, medication(s)/side effects and non-pharmacologic comfort measures 11/29/2023 0727 by Delores Kirsch, RN Outcome: Adequate for Discharge 11/29/2023 0725 by Delores Kirsch, RN Outcome: Progressing   Problem: Health Behavior/Discharge Planning: Goal: Ability to manage health-related needs will improve 11/29/2023 0727 by Delores Kirsch, RN Outcome: Adequate for Discharge 11/29/2023 0725 by Delores Kirsch, RN Outcome: Progressing   Problem: Clinical Measurements: Goal: Ability to maintain clinical measurements within normal limits will improve 11/29/2023 0727 by Delores Kirsch, RN Outcome: Adequate for Discharge 11/29/2023 0725 by Delores Kirsch, RN Outcome: Progressing Goal: Will remain free from infection Outcome: Adequate for Discharge Goal: Diagnostic test results will improve Outcome: Adequate for Discharge Goal: Respiratory complications will improve Outcome: Adequate for Discharge Goal: Cardiovascular complication will be avoided Outcome: Adequate for Discharge   Problem: Activity: Goal: Risk for activity intolerance will decrease Outcome: Adequate for Discharge   Problem: Nutrition: Goal: Adequate nutrition will be maintained Outcome: Adequate for Discharge   Problem: Coping: Goal: Level of anxiety will decrease Outcome: Adequate for Discharge   Problem: Elimination: Goal: Will not experience complications related to bowel motility Outcome: Adequate for Discharge Goal: Will not experience complications related to urinary retention Outcome: Adequate for Discharge   Problem: Pain Managment: Goal: General experience of comfort will improve and/or be controlled Outcome: Adequate for Discharge   Problem: Safety: Goal: Ability to remain free from injury will improve Outcome: Adequate for Discharge   Problem: Skin Integrity: Goal: Risk for  impaired skin integrity will decrease Outcome: Adequate for Discharge

## 2023-12-04 ENCOUNTER — Encounter: Admitting: Plastic Surgery

## 2023-12-04 NOTE — Telephone Encounter (Signed)
 Wound VAC rep called clinic stating that she spoke with home health nurse who had concerns about patient's wound VAC.  I called the patient in regards to this.  He states that the wound VAC was changed on Friday due to it malfunctioning and lasted most of the weekend, then home health changed the [facility] again on Monday.  He states that the home health nurse told him that everything was looking okay and is unsure what the concerns may be.  He reports that the wound VAC is functioning properly at this time.  He states that home health is coming back out tomorrow and will asked them.  I asked him to have the home health nurse give our clinic a call if they have any concerns about the wound VAC or the wound.  He expressed understanding.  I also did offer him an appointment this week for us  to take a look at the wound/change the wound VAC.  He declined at this time.  We will plan on having him come to the clinic next week though for evaluation/wound VAC change.  He expressed understanding to this and was in agreement with the plan.

## 2023-12-06 ENCOUNTER — Telehealth: Payer: Self-pay | Admitting: Student

## 2023-12-06 MED ORDER — SULFAMETHOXAZOLE-TRIMETHOPRIM 800-160 MG PO TABS: 1.0000 | ORAL_TABLET | Freq: Two times a day (BID) | ORAL | 0 refills | Status: AC

## 2023-12-06 NOTE — Telephone Encounter (Signed)
 Her clinic received a call from the patient's home health nurse in regards to patient's wound.  I attempted to call her back, but she did not answer so I left a voicemail to return my call.  I did though call the patient to check and see how he was doing.  He states overall he is doing well.  He states that the home health nurse did have a concern about the wound, but he was not exactly sure exactly what the concern was.  He does state that overall he is doing okay.  He denies any fevers or chills.  He does report that sometimes the wound is sore/sore during wound VAC changes, but today reports that the wound feels okay.  He does report that he has pinkish drainage in his canister and that he changes the canister about every day or day and a half.  I offered him an appointment for this afternoon, but he states that he cannot come in this afternoon.  I discussed with him that our clinic is closed tomorrow.  He expressed understanding.  I discussed with him that I will try to get in touch with his home health nurse, but in the meantime to keep a close eye on things and if he has any worsening symptoms to let us  know immediately.  Patient expressed understanding.  We will plan to see the patient back on Monday.

## 2023-12-06 NOTE — Telephone Encounter (Signed)
 ADDEND: I spoke with home health nurse who states that she was concerned about drain output and characteristic of drainage.  She felt that the drainage was cloudy.  She also stated that the patient has been noncompliant with his wound VAC as he had been disconnecting the wound VAC every so often.  She also states that she felt that she was told the wound VAC canister was filling up every 10-12 hours.  I called the patient and discussed his home health nurses concern with him.  I discussed with him the importance of keeping the wound VAC on suction and connected at all times.  Patient expressed understanding to this and stated that he and his home health nurse had a conversation about this.  I did recommend that the patient come in today again for evaluation, but he states that he cannot come in today.  I discussed with him that the office is closed tomorrow.  Recommended that nursing was concerned about cloudy output, I recommended that the patient be placed on an antibiotic to cover him through the weekend.  He expressed understanding and was in agreement with this.  Patient also states that his wound VAC canister has not been filling up every 10-12 hours, and states that this was a mess understanding and states that his wound VAC fills up every day to 1-1/2 days.  He still denies any new pain, swelling, fevers or chills.  I discussed with the patient that I would like him to keep a very close eye on things.  I discussed with him that if he has any concerning symptoms such as fevers, chills, swelling, pain, he will need to go to the emergency room.  Patient expressed understanding.  Patient has an appointment on Monday.    I instructed him to call in the meantime if he has questions or concerns.

## 2023-12-06 NOTE — Addendum Note (Signed)
 Addended by: ANDRIS STAGGER on: 12/06/2023 03:00 PM   Modules accepted: Orders

## 2023-12-10 ENCOUNTER — Encounter: Payer: Self-pay | Admitting: Student

## 2023-12-10 ENCOUNTER — Ambulatory Visit (INDEPENDENT_AMBULATORY_CARE_PROVIDER_SITE_OTHER): Admitting: Student

## 2023-12-10 VITALS — BP 136/91 | HR 88 | Temp 98.0°F

## 2023-12-10 DIAGNOSIS — S8012XA Contusion of left lower leg, initial encounter: Secondary | ICD-10-CM

## 2023-12-10 DIAGNOSIS — M7989 Other specified soft tissue disorders: Secondary | ICD-10-CM

## 2023-12-10 MED ORDER — SULFAMETHOXAZOLE-TRIMETHOPRIM 800-160 MG PO TABS: 1.0000 | ORAL_TABLET | Freq: Two times a day (BID) | ORAL | 0 refills | Status: AC

## 2023-12-10 NOTE — Progress Notes (Signed)
 Patient is a 58 year old male with history of a large traumatic wound to his left thigh.  He most recently underwent excision of left thigh wound, placement of myriad powder and placement of wound VAC with Dr. Lowery on 11/28/2023.  Patient is almost 2 weeks postop.  He presents to the clinic today for postoperative follow-up.  Today, patient overall is doing okay.  He states that the wound VAC has had a little bit of a smell to it.  He denies any fevers or chills.  He states that he feels like his leg is a little bit more swollen than usual, but is around his baseline.  He does state that he did a little bit more activity this weekend.  He denies any chest pain or shortness of breath.  He denies any worsening swelling in his lower leg.  Chaperone present on exam.  On exam, patient is sitting upright in no acute distress.  Wound VAC and Sorbact to the left lower extremity was removed.  There was a little bit of malodor noted.  The wound measured approximately 27 x 16 x 0.15 cm.  There was granulation tissue noted throughout the majority of the wound.  There was some unincorporated myriad noted especially medially.  There was erythema noted to the skin inferiorly and medially/posterior of the wound.  It was nontender to palpation.  It was blanchable.  The thigh was soft.  The lower leg did appear to be swollen.  There is no tenderness to palpation of the calf.  Sensation was intact distally.  Patient was able to wiggle his toes as well.  Canister at today's visit does appear to be full with pinkish/cloudy drainage.  Vashe soaked gauze were placed over the wound.  Adaptic, K-Y jelly and the wound VAC were placed over the wound.  Suction was achieved at 125 mmHg.  Patient tolerated well.  Discussed with the patient that given some of the redness and swelling to his left lower extremity, I would like to obtain ultrasound to rule out blood clot.  Patient states that swelling to the lower leg has been  regular for him since his injury, but he is unsure about the redness.  I discussed with him that we will try to get him in today or tomorrow, but I told him to continue to monitor the area and if he notices any worsening swelling, redness, chest pain, shortness of breath he needs to go to the emergency room.  Patient expressed understanding.  It was also discussed with the patient that I would like him to continue on antibiotics.  Discussed with him to finish current course of Bactrim and once he is done with that, he will start a new course.  He expressed understanding to this.  Discussed with patient to continue to monitor his leg closely, and if he has any fevers, chills or worsening symptoms he needs to contact us .  Patient expressed understanding.  I encouraged the patient to follow-up with his PCP as well in regards to leg swelling.  I recommended that he continue with compression to the lower leg and elevate it as often as he can.  Patient expressed understanding.  I would like the patient to follow back up next week.  Instructed him to call in the meantime if he has any questions or concerns about anything.  Pictures were obtained of the patient and placed in the chart with the patient's or guardian's permission.

## 2023-12-12 ENCOUNTER — Telehealth: Payer: Self-pay | Admitting: *Deleted

## 2023-12-12 ENCOUNTER — Telehealth: Payer: Self-pay

## 2023-12-12 NOTE — Telephone Encounter (Signed)
 Mandy from home health called to inform us  that the Endoform we requested is not covered by insurance. She let us  know that they told the patient he needs to reach out to [facility] to get supplies and he has not. I spoke to [place] and she said we do not have to do the Endoform since it is not covered.

## 2023-12-12 NOTE — Telephone Encounter (Signed)
 Received on (12/12/2023) via of fax [facility] Excessive Supply Prescription from [facility].  Requesting additional supplies for the patient.  Given to provider to complete.  [facility] Excessive Supply Prescription was completed and faxed back to [facility].  Confirmation received and copy scanned into the chart.//AR/CMA

## 2023-12-12 NOTE — Addendum Note (Signed)
 Addended by: ANDRIS STAGGER on: 12/12/2023 10:03 AM   Modules accepted: Orders

## 2023-12-14 ENCOUNTER — Telehealth (INDEPENDENT_AMBULATORY_CARE_PROVIDER_SITE_OTHER): Payer: Self-pay | Admitting: Nurse Practitioner

## 2023-12-14 NOTE — Telephone Encounter (Signed)
 Called pt 2x to schedule from order for DVT US  and LVM  LO. DVT. Swelling of left lower extremity, [place].

## 2023-12-17 ENCOUNTER — Telehealth: Payer: Self-pay | Admitting: Student

## 2023-12-17 NOTE — Telephone Encounter (Signed)
 Error

## 2023-12-19 ENCOUNTER — Telehealth: Payer: Self-pay | Admitting: *Deleted

## 2023-12-19 ENCOUNTER — Encounter: Admitting: Student

## 2023-12-19 NOTE — Telephone Encounter (Signed)
 Received on (12/14/2023) via of fax Physician Order from [facility].  Requesting signature,date,and return.  Given to provider to complete.    Physician Order completed and faxed back to [facility].  Confirmation received and copy scanned into the chart.//AR/CMA

## 2023-12-20 ENCOUNTER — Encounter: Payer: Self-pay | Admitting: Student

## 2023-12-20 ENCOUNTER — Other Ambulatory Visit (HOSPITAL_COMMUNITY)
Admission: RE | Admit: 2023-12-20 | Discharge: 2023-12-20 | Disposition: A | Discharge status: Home / Self Care | Attending: Student | Admitting: Student

## 2023-12-20 ENCOUNTER — Ambulatory Visit (INDEPENDENT_AMBULATORY_CARE_PROVIDER_SITE_OTHER): Admitting: Student

## 2023-12-20 VITALS — BP 141/91 | HR 76 | Ht 72.0 in | Wt 210.0 lb

## 2023-12-20 DIAGNOSIS — X58XXXA Exposure to other specified factors, initial encounter: Secondary | ICD-10-CM | POA: Diagnosis not present

## 2023-12-20 DIAGNOSIS — M7989 Other specified soft tissue disorders: Secondary | ICD-10-CM

## 2023-12-20 DIAGNOSIS — S8012XA Contusion of left lower leg, initial encounter: Secondary | ICD-10-CM

## 2023-12-20 DIAGNOSIS — S71102D Unspecified open wound, left thigh, subsequent encounter: Secondary | ICD-10-CM

## 2023-12-20 MED ORDER — SULFAMETHOXAZOLE-TRIMETHOPRIM 800-160 MG PO TABS: 1.0000 | ORAL_TABLET | Freq: Two times a day (BID) | ORAL | 0 refills | Status: AC

## 2023-12-20 NOTE — Progress Notes (Addendum)
 Referring Provider Gasper Nancyann BRAVO, MD [address] [place],  [place] [address]   CC:  Chief Complaint  Patient presents with   Post-op Follow-up      Andrew Clay is an 58 y.o. male.  HPI:   Patient is a 58 year old male with history of a large traumatic wound to his left thigh.  He most recently underwent excision of left thigh wound, placement of myriad powder and placement of wound VAC with Dr. Lowery on 11/28/2023.  Patient is 3 weeks postop.  He presents to the clinic today for postoperative follow-up.     Patient was last seen in the clinic on 12/10/2023.  At this visit, the wound measured approximately 27 x 16 x 0.15 cm.  There is granulation tissue noted throughout the majority of the wound.  There was some unincorporated myriad noted medially.  There was erythema noted to the skin inferiorly and medially/posterior of the wound.  It was nontender to palpation.  It was blanchable.  The thigh was soft.  The lower leg did appear to be swollen.  An ultrasound was ordered for the patient to rule out blood clot.  Antibiotics were also extended.  Today, patient reports he is doing okay.  He states that he did not get the ultrasound last week.  He states that he has been feeling okay.  He denies any fevers or chills.  He denies any chest pain or shortness of breath.  He states that he feels like the swelling has improved.  He does report that there is a small area of new redness to the skin just inferior to the wound.  He states that he still changing the canister about every 1 to 2 days.  Review of Systems General: Denies any fevers or chills  Physical Exam    12/20/2023    9:36 AM 12/10/2023   10:09 AM 11/29/2023    5:36 AM  Vitals with BMI  Height 6' 0    Weight 210 lbs    BMI 28.47    Systolic 141 136 849  Diastolic 91 91 89  Pulse 76 88 77    General:  No acute distress,  Alert and oriented, Non-Toxic, Normal speech and affect Chaperone present on exam.   On exam, patient sitting upright in no acute distress.  Wound is approximately 20 x 16 x 0.10 centimeters.  There appears to be healthy granulation tissue throughout the majority of the wound, there is some exudate/unincorporated myriad noted.  There is some erythema noted to the skin just inferior of the wound and to the posterior wound it is blanchable, but it is nontender to palpation.  There is no crepitus noted on exam.  The thigh is soft.  The lower leg still does appear to be swollen, similar to previous exam.  Wound was cleaned with Vashe soaked gauze.  Adaptic, K-Y jelly black foam sponge were placed over the wound.  Adequate seal of the wound VAC was achieved at 125 mmHg.  Patient tolerated well.  All  Assessment/Plan Traumatic hematoma of left lower leg, initial encounter - Plan: Aerobic/Anaerobic Culture w Gram Stain (surgical/deep wound)  Swelling of left lower extremity - Plan: Aerobic/Anaerobic Culture w Gram Stain (surgical/deep wound), US  Venous Img Lower Unilateral Left, CANCELED: VAS US  LOWER EXTREMITY VENOUS (DVT)   Discussed with the patient that I am concerned about the swelling in the redness noted to his leg.  Culture was obtained from the wound given the swelling  and redness.  Will continue the Bactrim for now.  I also discussed with the patient I would like him to get the ultrasound.  Ultrasound order has been placed and he is going to get ultrasound tomorrow.  I discussed with him that if he develops any chest pain or shortness of breath he needs to go to the emergency room.  I discussed with him to closely monitor his left lower extremity and if he develops any worsening swelling, redness or worsening symptoms he needs to let us  know.  Patient expressed understanding.  I also discussed with them that after patient's last appointment, I spoke with Dr. Lowery and she stated that if patient wanted to go to the OR for a skin graft he could.  He states that he would be  agreeable to this.  I discussed with him that I will have to talk with her given the redness and swelling continuing to persist.  He expressed understanding to this.  Patient to follow back up next week.  I instructed him to call in the meantime if he has any questions or concerns about anything.  Pictures were obtained of the patient and placed in the chart with the patient's or guardian's permission.   ADDEND: I spoke with Dr. Lowery, she would like to take the patient back to the operating room for irrigation and debridement of his wound with application of endoform.  I discussed this with the patient and he was agreeable with the plan.  Andrew Clay 12/20/2023, 11:42 AM

## 2023-12-20 NOTE — Addendum Note (Signed)
 Addended by: ANDRIS STAGGER on: 12/20/2023 04:36 PM   Modules accepted: Orders

## 2023-12-21 ENCOUNTER — Telehealth: Payer: Self-pay | Admitting: Student

## 2023-12-21 ENCOUNTER — Ambulatory Visit
Admission: RE | Admit: 2023-12-21 | Discharge: 2023-12-21 | Disposition: A | Discharge status: Home / Self Care | Source: Ambulatory Visit | Attending: Student | Admitting: Student

## 2023-12-21 DIAGNOSIS — M7989 Other specified soft tissue disorders: Secondary | ICD-10-CM | POA: Diagnosis present

## 2023-12-21 NOTE — Telephone Encounter (Signed)
 Patient had ultrasound of his left lower extremity done today to rule out DVT.  Results were as following:  IMPRESSION: No evidence of left lower extremity deep venous thrombosis.  I attempted to call the patient with the results and he did not answer.  I was unable to leave a voicemail as his voicemail box was full.

## 2023-12-24 ENCOUNTER — Telehealth: Payer: Self-pay

## 2023-12-24 ENCOUNTER — Other Ambulatory Visit: Payer: Self-pay | Admitting: Student

## 2023-12-24 ENCOUNTER — Encounter: Payer: Self-pay | Admitting: Student

## 2023-12-24 ENCOUNTER — Ambulatory Visit: Payer: Self-pay | Admitting: Student

## 2023-12-24 DIAGNOSIS — S8012XA Contusion of left lower leg, initial encounter: Secondary | ICD-10-CM

## 2023-12-24 DIAGNOSIS — M7989 Other specified soft tissue disorders: Secondary | ICD-10-CM

## 2023-12-24 NOTE — Telephone Encounter (Signed)
 Patient is aware of results.

## 2023-12-24 NOTE — Telephone Encounter (Signed)
 Patient had ultrasound of his left lower extremity done to rule out DVT.  Results were as following:   IMPRESSION: No evidence of left lower extremity deep venous thrombosis.   I attempted to call the patient with the results and he did not answer. I left a message for him to call the clinic back.

## 2023-12-24 NOTE — Progress Notes (Signed)
 Infectious disease referral given ongoing swelling and redness.  DVT has been ruled out with ultrasound last week.  Patient is not having any fevers or chills.  He is going to the OR later this week.

## 2023-12-24 NOTE — Progress Notes (Signed)
Surgery Orders

## 2023-12-25 ENCOUNTER — Other Ambulatory Visit: Payer: Self-pay

## 2023-12-25 ENCOUNTER — Encounter (HOSPITAL_COMMUNITY): Payer: Self-pay | Admitting: Plastic Surgery

## 2023-12-25 LAB — AEROBIC/ANAEROBIC CULTURE W GRAM STAIN (SURGICAL/DEEP WOUND)

## 2023-12-25 NOTE — Progress Notes (Signed)
 SDW CALL  Patient was given pre-op instructions over the phone. The opportunity was given for the patient to ask questions. No further questions asked. Patient verbalized understanding of instructions given.   PCP - Gasper Nancyann BRAVO, MD Cardiologist - denies  PPM/ICD - denies Device Orders -  Rep Notified -   Chest x-ray - denies EKG - 11/06/23 Stress Test - denies ECHO - denies Cardiac Cath - denies  Sleep Study - denies CPAP -   Fasting Blood Sugar - na Checks Blood Sugar _____ times a day  Blood Thinner Instructions:na Aspirin Instructions:na  ERAS Protcol -npo PRE-SURGERY Ensure or G2-   COVID TEST- na   Anesthesia review: yes-pt's last Hgb 8.2 11/28/23., documented in [place], [place] 's note on 11/29/23.  Patient denies shortness of breath, fever, cough and chest pain over the phone call   Special instructions:    Oral Hygiene is also important to reduce your risk of infection.  Remember - BRUSH YOUR TEETH THE MORNING OF SURGERY WITH YOUR REGULAR TOOTHPASTE

## 2023-12-26 ENCOUNTER — Encounter (HOSPITAL_COMMUNITY): Admission: RE | Disposition: A | Payer: Self-pay | Source: Home / Self Care | Attending: Plastic Surgery

## 2023-12-26 ENCOUNTER — Telehealth: Payer: Self-pay | Admitting: Plastic Surgery

## 2023-12-26 ENCOUNTER — Encounter (HOSPITAL_COMMUNITY): Payer: Self-pay | Admitting: Plastic Surgery

## 2023-12-26 ENCOUNTER — Ambulatory Visit (HOSPITAL_COMMUNITY): Admitting: Physician Assistant

## 2023-12-26 ENCOUNTER — Ambulatory Visit (HOSPITAL_BASED_OUTPATIENT_CLINIC_OR_DEPARTMENT_OTHER): Admitting: Physician Assistant

## 2023-12-26 ENCOUNTER — Ambulatory Visit (HOSPITAL_COMMUNITY)
Admission: RE | Admit: 2023-12-26 | Discharge: 2023-12-26 | Disposition: A | Discharge status: Home / Self Care | Attending: Plastic Surgery | Admitting: Plastic Surgery

## 2023-12-26 ENCOUNTER — Other Ambulatory Visit: Payer: Self-pay

## 2023-12-26 ENCOUNTER — Ambulatory Visit: Admitting: Student

## 2023-12-26 DIAGNOSIS — S8012XD Contusion of left lower leg, subsequent encounter: Secondary | ICD-10-CM

## 2023-12-26 DIAGNOSIS — X58XXXA Exposure to other specified factors, initial encounter: Secondary | ICD-10-CM | POA: Insufficient documentation

## 2023-12-26 DIAGNOSIS — S81802A Unspecified open wound, left lower leg, initial encounter: Secondary | ICD-10-CM | POA: Insufficient documentation

## 2023-12-26 DIAGNOSIS — S81802D Unspecified open wound, left lower leg, subsequent encounter: Secondary | ICD-10-CM | POA: Diagnosis not present

## 2023-12-26 DIAGNOSIS — S8012XA Contusion of left lower leg, initial encounter: Secondary | ICD-10-CM | POA: Diagnosis present

## 2023-12-26 HISTORY — PX: INCISION AND DRAINAGE OF WOUND: SHX1803

## 2023-12-26 HISTORY — PX: APPLICATION OF WOUND VAC: SHX5189

## 2023-12-26 HISTORY — PX: APPLICATION, SKIN SUBSTITUTE: SHX7530

## 2023-12-26 LAB — CBC
HCT: 36.2 % — ABNORMAL LOW (ref 39.0–52.0)
Hemoglobin: 11.5 g/dL — ABNORMAL LOW (ref 13.0–17.0)
MCH: 28.2 pg (ref 26.0–34.0)
MCHC: 31.8 g/dL (ref 30.0–36.0)
MCV: 88.7 fL (ref 80.0–100.0)
Platelets: 286 K/uL (ref 150–400)
RBC: 4.08 MIL/uL — ABNORMAL LOW (ref 4.22–5.81)
RDW: 13.4 % (ref 11.5–15.5)
WBC: 6.9 K/uL (ref 4.0–10.5)
nRBC: 0 % (ref 0.0–0.2)

## 2023-12-26 SURGERY — IRRIGATION AND DEBRIDEMENT WOUND
Anesthesia: General | Site: Thigh | Laterality: Left

## 2023-12-26 MED ORDER — LIDOCAINE 2% (20 MG/ML) 5 ML SYRINGE
INTRAMUSCULAR | Status: DC | PRN
Start: 2023-12-26 — End: 2023-12-26
  Administered 2023-12-26: 100 mg via INTRAVENOUS

## 2023-12-26 MED ORDER — OXYCODONE HCL 5 MG/5ML PO SOLN: 5.0000 mg | Freq: Once | ORAL | Status: DC | PRN

## 2023-12-26 MED ORDER — PHENYLEPHRINE 80 MCG/ML (10ML) SYRINGE FOR IV PUSH (FOR BLOOD PRESSURE SUPPORT)
PREFILLED_SYRINGE | INTRAVENOUS | Status: DC | PRN
  Administered 2023-12-26: 80 ug via INTRAVENOUS

## 2023-12-26 MED ORDER — VASHE WOUND IRRIGATION OPTIME
TOPICAL | Status: DC | PRN
  Administered 2023-12-26: 34 [oz_av]

## 2023-12-26 MED ORDER — CHLORHEXIDINE GLUCONATE 0.12 % MT SOLN
15.0000 mL | Freq: Once | OROMUCOSAL | Status: AC
  Administered 2023-12-26: 15 mL via OROMUCOSAL
  Filled 2023-12-26: qty 15

## 2023-12-26 MED ORDER — ORAL CARE MOUTH RINSE: 15.0000 mL | Freq: Once | OROMUCOSAL | Status: AC

## 2023-12-26 MED ORDER — ACETAMINOPHEN 10 MG/ML IV SOLN
INTRAVENOUS | Status: AC
Start: 2023-12-26 — End: 2023-12-26
  Filled 2023-12-26: qty 100

## 2023-12-26 MED ORDER — SODIUM CHLORIDE 0.9% FLUSH: 3.0000 mL | Freq: Two times a day (BID) | INTRAVENOUS | Status: DC

## 2023-12-26 MED ORDER — FENTANYL CITRATE (PF) 250 MCG/5ML IJ SOLN
INTRAMUSCULAR | Status: DC | PRN
  Administered 2023-12-26: 50 ug via INTRAVENOUS

## 2023-12-26 MED ORDER — FENTANYL CITRATE (PF) 100 MCG/2ML IJ SOLN
INTRAMUSCULAR | Status: AC
  Filled 2023-12-26: qty 2

## 2023-12-26 MED ORDER — ONDANSETRON HCL 4 MG/2ML IJ SOLN: 4.0000 mg | Freq: Once | INTRAMUSCULAR | Status: DC | PRN

## 2023-12-26 MED ORDER — CHLORHEXIDINE GLUCONATE CLOTH 2 % EX PADS: 6.0000 | MEDICATED_PAD | Freq: Once | CUTANEOUS | Status: DC

## 2023-12-26 MED ORDER — HYDROMORPHONE HCL 1 MG/ML IJ SOLN: 0.2500 mg | INTRAMUSCULAR | Status: DC | PRN

## 2023-12-26 MED ORDER — LACTATED RINGERS IV SOLN: INTRAVENOUS | Status: DC

## 2023-12-26 MED ORDER — ACETAMINOPHEN 325 MG PO TABS: 650.0000 mg | ORAL_TABLET | ORAL | Status: DC | PRN

## 2023-12-26 MED ORDER — CEFAZOLIN SODIUM-DEXTROSE 2-4 GM/100ML-% IV SOLN
2.0000 g | INTRAVENOUS | Status: AC
  Administered 2023-12-26: 2 g via INTRAVENOUS
  Filled 2023-12-26: qty 100

## 2023-12-26 MED ORDER — ONDANSETRON HCL 4 MG/2ML IJ SOLN
INTRAMUSCULAR | Status: DC | PRN
  Administered 2023-12-26: 4 mg via INTRAVENOUS

## 2023-12-26 MED ORDER — SODIUM CHLORIDE 0.9 % IV SOLN: 250.0000 mL | INTRAVENOUS | Status: DC | PRN

## 2023-12-26 MED ORDER — MIDAZOLAM HCL (PF) 2 MG/2ML IJ SOLN
INTRAMUSCULAR | Status: DC | PRN
Start: 2023-12-26 — End: 2023-12-26
  Administered 2023-12-26: 2 mg via INTRAVENOUS

## 2023-12-26 MED ORDER — ACETAMINOPHEN 650 MG RE SUPP: 650.0000 mg | RECTAL | Status: DC | PRN

## 2023-12-26 MED ORDER — OXYCODONE HCL 5 MG PO TABS: 5.0000 mg | ORAL_TABLET | ORAL | Status: DC | PRN

## 2023-12-26 MED ORDER — FENTANYL CITRATE (PF) 100 MCG/2ML IJ SOLN: 25.0000 ug | INTRAMUSCULAR | Status: DC | PRN

## 2023-12-26 MED ORDER — LIDOCAINE 2% (20 MG/ML) 5 ML SYRINGE
INTRAMUSCULAR | Status: AC
  Filled 2023-12-26: qty 5

## 2023-12-26 MED ORDER — ACETAMINOPHEN 10 MG/ML IV SOLN
INTRAVENOUS | Status: DC | PRN
  Administered 2023-12-26: 1000 mg via INTRAVENOUS

## 2023-12-26 MED ORDER — DEXAMETHASONE SOD PHOSPHATE PF 10 MG/ML IJ SOLN
INTRAMUSCULAR | Status: DC | PRN
Start: 2023-12-26 — End: 2023-12-26
  Administered 2023-12-26: 10 mg via INTRAVENOUS

## 2023-12-26 MED ORDER — PHENYLEPHRINE 80 MCG/ML (10ML) SYRINGE FOR IV PUSH (FOR BLOOD PRESSURE SUPPORT)
PREFILLED_SYRINGE | INTRAVENOUS | Status: AC
  Filled 2023-12-26: qty 10

## 2023-12-26 MED ORDER — OXYCODONE HCL 5 MG PO TABS: 5.0000 mg | ORAL_TABLET | Freq: Once | ORAL | Status: DC | PRN

## 2023-12-26 MED ORDER — MIDAZOLAM HCL 2 MG/2ML IJ SOLN
INTRAMUSCULAR | Status: AC
  Filled 2023-12-26: qty 2

## 2023-12-26 MED ORDER — SODIUM CHLORIDE 0.9% FLUSH: 3.0000 mL | INTRAVENOUS | Status: DC | PRN

## 2023-12-26 MED ORDER — PROPOFOL 10 MG/ML IV BOLUS
INTRAVENOUS | Status: DC | PRN
Start: 2023-12-26 — End: 2023-12-26
  Administered 2023-12-26: 170 mg via INTRAVENOUS

## 2023-12-26 MED ORDER — PROPOFOL 10 MG/ML IV BOLUS
INTRAVENOUS | Status: AC
  Filled 2023-12-26: qty 20

## 2023-12-26 SURGICAL SUPPLY — 22 items
CANISTER SUCTION 3000ML PPV (SUCTIONS) ×1 IMPLANT
CLEANSER WND VASHE INSTL 34OZ (WOUND CARE) IMPLANT
COVER SURGICAL LIGHT HANDLE (MISCELLANEOUS) ×1 IMPLANT
DRAPE HALF SHEET 40X57 (DRAPES) IMPLANT
DRAPE INCISE IOBAN 66X45 STRL (DRAPES) IMPLANT
DRESSING MORCELLS FINE 1000 (Tissue) IMPLANT
DRSG CUTIMED SORBACT 7X9 (GAUZE/BANDAGES/DRESSINGS) IMPLANT
DRSG VAC GRANUFOAM MED (GAUZE/BANDAGES/DRESSINGS) IMPLANT
GLOVE BIO SURGEON STRL SZ 6.5 (GLOVE) ×1 IMPLANT
GLOVE BIOGEL M 6.5 STRL (GLOVE) ×1 IMPLANT
GOWN STRL REUS W/ TWL LRG LVL3 (GOWN DISPOSABLE) ×3 IMPLANT
GRAFT MYRIAD 3 LAYER 10X20 (Graft) IMPLANT
KIT BASIN OR (CUSTOM PROCEDURE TRAY) ×1 IMPLANT
KIT TURNOVER KIT B (KITS) ×1 IMPLANT
PACK GENERAL/GYN (CUSTOM PROCEDURE TRAY) ×1 IMPLANT
PACK UNIVERSAL I (CUSTOM PROCEDURE TRAY) ×1 IMPLANT
PAD ARMBOARD POSITIONER FOAM (MISCELLANEOUS) ×2 IMPLANT
STAPLER SKIN PROX 35W (STAPLE) ×1 IMPLANT
SURGILUBE 2OZ TUBE FLIPTOP (MISCELLANEOUS) IMPLANT
SUT VIC AB 4-0 PS2 27 (SUTURE) IMPLANT
TOWEL GREEN STERILE (TOWEL DISPOSABLE) ×1 IMPLANT
UNDERPAD 30X36 HEAVY ABSORB (UNDERPADS AND DIAPERS) ×1 IMPLANT

## 2023-12-26 NOTE — Op Note (Signed)
 DATE OF OPERATION: 12/26/2023  LOCATION: [name]  PREOPERATIVE DIAGNOSIS: Left leg wound 11 x 22 cm  POSTOPERATIVE DIAGNOSIS: Same  PROCEDURE: Preparation of left leg wound for placement of Myriad powder and sheet (1 gm and 10 x 20 cm sheet), VAC placement  SURGEON: Estefana Fritter, DO  ASSISTANT: Honora Seip, PA  EBL: none  CONDITION: Stable  COMPLICATIONS: None  INDICATION: The patient, Andrew Clay, is a 58 y.o. male born on September 16, 1965, is here for treatment of a left leg wound.   PROCEDURE DETAILS:  The patient was seen prior to surgery and marked.  The IV antibiotics were given. The patient was taken to the operating room and given a general anesthetic. A standard time out was performed and all information was confirmed by those in the room. The left leg was prepped and draped.  The leg wound 11 x 22 cm was irrigated with Vashe.  All of the myriad powder and sheet was used.  The myriad and sorbact were sutured in place with the 4-0 Vicryl.  Ky gel and the vac was applied.  There was an excellent seal. The patient was allowed to wake up and taken to recovery room in stable condition at the end of the case. The family was notified at the end of the case.   The advanced practice practitioner (APP) assisted throughout the case.  The APP was essential in retraction and counter traction when needed to make the case progress smoothly.  This retraction and assistance made it possible to see the tissue plans for the procedure.  The assistance was needed for blood control, tissue re-approximation and assisted with closure of the incision site.

## 2023-12-26 NOTE — Discharge Instructions (Signed)
Wound Care   Guide to Wound Care  Proper wound care may reduce the risk of infection, improve healing rates, and limit scarring.  This is a general guide to help care for and manage wounds treated with product wound matrix.   Dressing Changes The frequency of dressing changes can vary based on which product was applied, the size of the wound, or the amount of wound drainage. Dressing inspections are recommended, at least weekly.   If you have a Wound VAC it will be changed in one week after the first time it is applied.  Then it will be changed once or twice a week.   If you don't have a Wound VAC, then place KY gel on the wound daily and cover with gauze.  Dressing Types Primary Dressing:  Non-adherent dressing goes directly over wounds being treated with the powder or sheet.  Secondary Dressing:  Secures the primary dressing in place and provides extra protection, compression, and absorption.  1. Wash Hands - To help decrease the risk of infection, caregivers should wash their hands for a minimum of 20 seconds and may use medical gloves.   2. Remove the Dressings - Avoid removing product from the wound by carefully removing the applicable dressing(s) at the time points recommended above, or as recommended by the treating physician.  Expected Color and Odor:  It is entirely normal for the wound to have an unpleasant odor and to form a caramel-colored gel as the product absorbs into the wound. It is important to leave this gel on the wound site.  3. Clean the Wound - Use clean water or saline to gently rinse around the wound surface and remove any excess discharge that may be present on the wound. Do not wipe off any of the caramel-colored gel on the wound.   What to look out for:  Large or increased amount of drainage   Surrounding skin has worsening redness or hot to touch   Increased pain in or around the wound   Flu-like symptoms, fatigue, decreased appetite, fever   Hard, crusty wound  surface with black or brown coloring  4. Apply New Dressings - Dressings should cover the entire wound and be suitable for maintaining a moist wound environment.  The non-adherent mesh dressing should be left in place.  New dressing should consist of KY Jelly to keep the wound moist and soft gauze secured with a wrap or tape.   Maintain a Hydrated Wound Area It is important to keep the wound area moist throughout the healing process. If the wound appears to be dry during dressing changes, select a dressing that will hydrate the wound and maintain that ideal moist environment. If you are unsure what to do, ask the treating physician.  Remodeling Process Every patient heals differently, and no two cases are the same. The size and location of the wound, product type and layering configurations, and general patient health all contribute to how quickly a wound will heal.  While many factors can influence the rate at which the product absorbs, the following can be used as a general guide.   THINGS TO DO: Refrain from smoking High protein diet with plenty of vegetables and some fruit  Limit simple processed carbohydrates and sugar Protect the wound from trauma Protect the dressing  powder       Sheet            Sorbact dressing  

## 2023-12-26 NOTE — Transfer of Care (Cosign Needed)
 Immediate Anesthesia Transfer of Care Note  Patient: Andrew Clay  Procedure(s) Performed: IRRIGATION AND DEBRIDEMENT WOUND (Left: Thigh) APPLICATION, SKIN SUBSTITUTE (Left: Thigh)  Patient Location: PACU  Anesthesia Type:General  Level of Consciousness: drowsy  Airway & Oxygen Therapy: Patient Spontanous Breathing and Patient connected to face mask oxygen  Post-op Assessment: Report given to RN and Post -op Vital signs reviewed and stable  Post vital signs: Reviewed and stable  Last Vitals:  Vitals Value Taken Time  BP 123/82 12/26/23 16:15  Temp    Pulse 63 12/26/23 16:21  Resp 16 12/26/23 16:21  SpO2 95 % 12/26/23 16:21  Vitals shown include unfiled device data.  Last Pain:  Vitals:   12/26/23 1146  TempSrc:   PainSc: 0-No pain      Patients Stated Pain Goal: 0 (12/26/23 1146)  Complications: There were no known notable events for this encounter.

## 2023-12-26 NOTE — H&P (Signed)
 Andrew Clay is an 58 y.o. male.   Chief Complaint: left leg wound HPI: The patient is a 58 yrs old male here for treatment of his left leg.  He was involved in an accident.  He has undergone multiple dbridements of his left leg. He had myriad placed and is coming back for more care.  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    Past Surgical History:  Procedure Laterality Date   APPLICATION OF WOUND VAC Left 11/09/2023   Procedure: APPLICATION, WOUND VAC;  Surgeon: Marea Selinda GORMAN, MD;  Location: [facility];  Service: General;  Laterality: Left;  WOUND VAC EXCHANGE   APPLICATION OF WOUND VAC Left 11/12/2023   Procedure: APPLICATION, WOUND VAC;  Surgeon: Marea Selinda GORMAN, MD;  Location: [facility];  Service: General;  Laterality: Left;  WOUND VAC EXCHANGE   APPLICATION OF WOUND VAC Left 11/15/2023   Procedure: APPLICATION, WOUND VAC;  Surgeon: Marea Selinda GORMAN, MD;  Location: [facility];  Service: General;  Laterality: Left;  WOUND VAC EXCHANGE   APPLICATION OF WOUND VAC Left 11/28/2023   Procedure: APPLICATION, WOUND VAC;  Surgeon: Lowery Estefana GORMAN, DO;  Location: [facility];  Service: Plastics;  Laterality: Left;   HEMATOMA EVACUATION Left 11/07/2023   Procedure: EVACUATION HEMATOMA;  Surgeon: Marea Selinda GORMAN, MD;  Location: [facility];  Service: General;  Laterality: Left;  LEFT THIGH WOUND   INCISION AND DRAINAGE OF WOUND Left 11/15/2023   Procedure: IRRIGATION AND DEBRIDEMENT WOUND;  Surgeon: Marea Selinda GORMAN, MD;  Location: [facility];  Service: General;  Laterality: Left;   INCISION AND DRAINAGE OF WOUND Left 11/28/2023   Procedure: IRRIGATION AND DEBRIDEMENT WOUND right leg debridement with myriad and vac placement;  Surgeon: Lowery Estefana GORMAN, DO;  Location: [facility] OR;  Service: Plastics;  Laterality: Left;  right leg debridement with myriad and vac placement   IRRIGATION AND DEBRIDEMENT HEMATOMA Left 11/09/2023   Procedure: IRRIGATION AND  DEBRIDEMENT HEMATOMA;  Surgeon: Marea Selinda GORMAN, MD;  Location: [facility];  Service: General;  Laterality: Left;  LEFT THIGH WOUND   IRRIGATION AND DEBRIDEMENT HEMATOMA Left 11/12/2023   Procedure: IRRIGATION AND DEBRIDEMENT HEMATOMA;  Surgeon: Marea Selinda GORMAN, MD;  Location: [facility];  Service: General;  Laterality: Left;  LEFT THIGH WOUND    Family History  Adopted: Yes  Problem Relation Age of Onset   Stroke Mother    Diabetes Father    Social History:  reports that he has never smoked. He has never used smokeless tobacco. He reports current alcohol use. He reports that he does not use drugs.  Allergies: No Known Allergies  Medications Prior to Admission  Medication Sig Dispense Refill   ibuprofen (ADVIL) 200 MG tablet Take 200-600 mg by mouth every 6 (six) hours as needed.     ascorbic acid (VITAMIN C) 500 MG tablet Take 1 tablet (500 mg total) by mouth daily. (Patient not taking: Reported on 12/20/2023) 30 tablet 2   folic acid (FOLVITE) 1 MG tablet Take 1 tablet (1 mg total) by mouth daily. (Patient not taking: Reported on 12/20/2023) 30 tablet 2   iron polysaccharides (NIFEREX) 150 MG capsule Take 1 capsule (150 mg total) by mouth daily. (Patient not taking: Reported on 12/20/2023) 30 capsule 2   Vitamin D, Ergocalciferol, (DRISDOL) 1.25 MG (50000 UNIT) CAPS capsule Take 1 capsule (50,000 Units total) by mouth every 7 (seven) days. (  Patient not taking: Reported on 12/20/2023) 12 capsule 0    Results for orders placed or performed during the hospital encounter of 12/26/23 (from the past 48 hours)  CBC per protocol     Status: Abnormal   Collection Time: 12/26/23 12:00 PM  Result Value Ref Range   WBC 6.9 4.0 - 10.5 K/uL   RBC 4.08 (L) 4.22 - 5.81 MIL/uL   Hemoglobin 11.5 (L) 13.0 - 17.0 g/dL   HCT 63.7 (L) 60.9 - 47.9 %   MCV 88.7 80.0 - 100.0 fL   MCH 28.2 26.0 - 34.0 pg   MCHC 31.8 30.0 - 36.0 g/dL   RDW 86.5 88.4 - 84.4 %   Platelets 286 150 - 400 K/uL   nRBC 0.0 0.0 - 0.2 %     Comment: Performed at [facility], [address], [place], [place] [address]   No results found.  Review of Systems  Constitutional:  Positive for activity change. Negative for appetite change.  Eyes: Negative.   Respiratory: Negative.    Cardiovascular:  Positive for leg swelling.  Gastrointestinal: Negative.   Endocrine: Negative.   Genitourinary: Negative.   Musculoskeletal:  Positive for joint swelling.    Blood pressure (!) 146/93, pulse 75, temperature 98.6 F (37 C), temperature source Oral, resp. rate 18, height 6' (1.829 m), weight 95.3 kg, SpO2 99%. Physical Exam Vitals reviewed.  Constitutional:      Appearance: Normal appearance.  Cardiovascular:     Rate and Rhythm: Normal rate.     Pulses: Normal pulses.  Abdominal:     Palpations: Abdomen is soft.  Musculoskeletal:        General: Swelling and tenderness present.  Skin:    General: Skin is warm.     Capillary Refill: Capillary refill takes less than 2 seconds.     Findings: Bruising present.  Neurological:     Mental Status: He is alert and oriented to person, place, and time.  Psychiatric:        Mood and Affect: Mood normal.        Behavior: Behavior normal.      Assessment/Plan Left leg wound - plan for debridement and Myriad placement.  Estefana RAMAN Ravon Mortellaro, DO 12/26/2023, 3:14 PM

## 2023-12-26 NOTE — Anesthesia Preprocedure Evaluation (Signed)
 Anesthesia Evaluation  Patient identified by MRN, date of birth, ID band Patient awake    Reviewed: Allergy & Precautions, NPO status , Patient's Chart, lab work & pertinent test results  Airway Mallampati: I  TM Distance: >3 FB Neck ROM: Full    Dental  (+) Teeth Intact, Dental Advisory Given, Chipped,    Pulmonary neg pulmonary ROS   Pulmonary exam normal breath sounds clear to auscultation       Cardiovascular negative cardio ROS Normal cardiovascular exam Rhythm:Regular Rate:Normal     Neuro/Psych   Anxiety     negative neurological ROS     GI/Hepatic negative GI ROS, Neg liver ROS,,,  Endo/Other  negative endocrine ROS    Renal/GU negative Renal ROS  negative genitourinary   Musculoskeletal negative musculoskeletal ROS (+)  Open wound left thigh   Abdominal   Peds  Hematology  (+) Blood dyscrasia, anemia   Anesthesia Other Findings   Reproductive/Obstetrics                              Anesthesia Physical Anesthesia Plan  ASA: 2  Anesthesia Plan: General   Post-op Pain Management: Tylenol PO (pre-op)* and Dilaudid IV   Induction: Intravenous  PONV Risk Score and Plan: 3 and Ondansetron, Dexamethasone and Midazolam  Airway Management Planned: LMA  Additional Equipment: None  Intra-op Plan:   Post-operative Plan: Extubation in OR  Informed Consent: I have reviewed the patients History and Physical, chart, labs and discussed the procedure including the risks, benefits and alternatives for the proposed anesthesia with the patient or authorized representative who has indicated his/her understanding and acceptance.     Dental advisory given  Plan Discussed with: CRNA  Anesthesia Plan Comments:          Anesthesia Quick Evaluation

## 2023-12-26 NOTE — Anesthesia Procedure Notes (Signed)
 Procedure Name: LMA Insertion Date/Time: 12/26/2023 3:49 PM  Performed by: Jerl Donald LABOR, CRNAPre-anesthesia Checklist: Patient identified, Emergency Drugs available, Suction available and Patient being monitored Patient Re-evaluated:Patient Re-evaluated prior to induction Oxygen Delivery Method: Circle System Utilized Preoxygenation: Pre-oxygenation with 100% oxygen Induction Type: IV induction Ventilation: Mask ventilation without difficulty LMA: LMA inserted LMA Size: 4.0 Number of attempts: 1 Placement Confirmation: positive ETCO2 and breath sounds checked- equal and bilateral Tube secured with: Tape Dental Injury: Teeth and Oropharynx as per pre-operative assessment

## 2023-12-26 NOTE — Anesthesia Postprocedure Evaluation (Signed)
 Anesthesia Post Note  Patient: Krystal RAMAN Dosh  Procedure(s) Performed: IRRIGATION AND DEBRIDEMENT WOUND (Left: Thigh) APPLICATION, SKIN SUBSTITUTE (Left: Thigh) APPLICATION, WOUND VAC (Left: Thigh)     Patient location during evaluation: PACU Anesthesia Type: General Level of consciousness: awake and alert Pain management: pain level controlled Vital Signs Assessment: post-procedure vital signs reviewed and stable Respiratory status: spontaneous breathing, nonlabored ventilation and respiratory function stable Cardiovascular status: blood pressure returned to baseline and stable Postop Assessment: no apparent nausea or vomiting Anesthetic complications: no   There were no known notable events for this encounter.  Last Vitals:  Vitals:   12/26/23 1645 12/26/23 1700  BP: (!) 137/92 (!) 122/110  Pulse: 61 (!) 59  Resp: 16 14  Temp:    SpO2: 97% 99%    Last Pain:  Vitals:   12/26/23 1645  TempSrc:   PainSc: 0-No pain                 Garnette FORBES Skillern

## 2023-12-26 NOTE — Telephone Encounter (Signed)
 Spouse called here to asking where to pick up pt, He does not have a DPR on file and [facility] did not know what a DPR was. I told them I can not give the spouse any information bc she was not listed in his chart under DPR for me to speak with. She stated she will have the nurse call the spouse

## 2023-12-27 ENCOUNTER — Encounter: Payer: Self-pay | Admitting: Physician Assistant

## 2023-12-27 ENCOUNTER — Encounter (HOSPITAL_COMMUNITY): Payer: Self-pay | Admitting: Plastic Surgery

## 2023-12-28 NOTE — Telephone Encounter (Signed)
 Patient aware of both results of culture and DVT study. Discussed with him culture appears to be consistent with normal skin flora. Discussed with him to continue to monitor his leg and symptoms. Discussed with him to call us  if he has any fevers, chills, worsening redness, swelling, or pain. He expressed understanding

## 2024-01-01 ENCOUNTER — Encounter: Admitting: Plastic Surgery

## 2024-01-08 ENCOUNTER — Encounter: Payer: Self-pay | Admitting: Infectious Diseases

## 2024-01-08 ENCOUNTER — Ambulatory Visit: Attending: Infectious Diseases | Admitting: Infectious Diseases

## 2024-01-08 VITALS — BP 177/99 | HR 82 | Temp 98.0°F | Ht 72.0 in | Wt 210.0 lb

## 2024-01-08 DIAGNOSIS — I872 Venous insufficiency (chronic) (peripheral): Secondary | ICD-10-CM

## 2024-01-08 DIAGNOSIS — S71102D Unspecified open wound, left thigh, subsequent encounter: Secondary | ICD-10-CM | POA: Insufficient documentation

## 2024-01-08 DIAGNOSIS — R600 Localized edema: Secondary | ICD-10-CM | POA: Diagnosis not present

## 2024-01-08 DIAGNOSIS — X58XXXD Exposure to other specified factors, subsequent encounter: Secondary | ICD-10-CM | POA: Insufficient documentation
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
## 2024-01-08 NOTE — Patient Instructions (Signed)
 You are here for follow up of the left leg- you have a lot of dependent edema and lymphedema- get back to your PCP/Plastic/Vascular surgery to see how you can reduce the swelling- ? Keep the legs elevated even while working Compression socks ? Lymphedema clinic No need for antibiotics as there is no infection currently

## 2024-01-11 ENCOUNTER — Encounter: Payer: Self-pay | Admitting: Plastic Surgery

## 2024-01-11 ENCOUNTER — Ambulatory Visit: Admitting: Plastic Surgery

## 2024-01-11 VITALS — BP 168/95 | HR 86 | Temp 97.6°F

## 2024-01-11 DIAGNOSIS — S81802A Unspecified open wound, left lower leg, initial encounter: Secondary | ICD-10-CM

## 2024-01-11 DIAGNOSIS — S81802D Unspecified open wound, left lower leg, subsequent encounter: Secondary | ICD-10-CM | POA: Diagnosis not present

## 2024-01-11 NOTE — Progress Notes (Signed)
   Subjective:    Patient ID: Andrew Clay, male    DOB: 1965-05-30, 58 y.o.   MRN: [id number]  HPI The patient is a 58 year old male here for evaluation of his left leg.  He had a big wound on the leg and underwent multiple debridements with myriad placement.  He has the [facility] which has been working well for the wound.  It has irritated this periwound area.  I am in to give him a 3-day holiday from the [facility] to see if we can get the periwound's skin healed.   Review of Systems  Constitutional: Negative.   Eyes: Negative.   Respiratory: Negative.    Cardiovascular: Negative.   Gastrointestinal: Negative.   Genitourinary: Negative.   Musculoskeletal: Negative.   Skin:  Positive for wound.       Objective:   Physical Exam HENT:     Head: Atraumatic.  Cardiovascular:     Rate and Rhythm: Normal rate.     Pulses: Normal pulses.  Pulmonary:     Effort: Pulmonary effort is normal.  Musculoskeletal:        General: Swelling and signs of injury present.       Legs:  Skin:    General: Skin is warm.     Capillary Refill: Capillary refill takes less than 2 seconds.     Findings: Bruising and lesion present.  Neurological:     Mental Status: He is alert and oriented to person, place, and time.  Psychiatric:        Mood and Affect: Mood normal.        Behavior: Behavior normal.        Thought Content: Thought content normal.        Judgment: Judgment normal.        Assessment & Plan:     ICD-10-CM   1. Leg wound, left, initial encounter  S81.802A        I placed an Urgo dressing on the area.  He can take that off on Monday and the nurse can put the [facility] back on.  And we will work towards split-thickness skin graft.

## 2024-01-15 ENCOUNTER — Ambulatory Visit: Admitting: Plastic Surgery

## 2024-01-15 VITALS — BP 173/94 | HR 82

## 2024-01-15 DIAGNOSIS — S81802A Unspecified open wound, left lower leg, initial encounter: Secondary | ICD-10-CM

## 2024-01-15 NOTE — Progress Notes (Signed)
   Subjective:    Patient ID: Andrew Clay, male    DOB: [birth year], 58 y.o.   MRN: [id number]  The patient is a 58 year old male here for evaluation of his left leg.  He has a large wound that has been slow to heal.  It is filling in nicely and today it looks clean.  The periwound area actually looks better than it did last week.  The home health nurse was concerned so she had him come in.  He says it looks better than it even did yesterday.      Review of Systems  Constitutional: Negative.   HENT: Negative.    Eyes: Negative.   Respiratory: Negative.    Cardiovascular: Negative.   Gastrointestinal: Negative.   Musculoskeletal: Negative.        Objective:   Physical Exam Vitals reviewed.  Cardiovascular:     Pulses: Normal pulses.  Pulmonary:     Effort: Pulmonary effort is normal.  Musculoskeletal:       Legs:  Skin:    Capillary Refill: Capillary refill takes less than 2 seconds.  Neurological:     Mental Status: He is alert and oriented to person, place, and time.  Psychiatric:        Mood and Affect: Mood normal.        Behavior: Behavior normal.        Thought Content: Thought content normal.        Judgment: Judgment normal.           Assessment & Plan:     ICD-10-CM   1. Wound of left lower extremity, initial encounter  S81.802A        I was able to put an Urgo dressing on it with ABDs and a Coban.  We are still planning on surgery.  The [facility] can go back on whenever the nurse returns.  Pictures were obtained of the patient and placed in the chart with the patient's or guardian's permission.

## 2024-01-15 NOTE — Progress Notes (Signed)
 Entered in error

## 2024-01-15 NOTE — H&P (View-Only) (Signed)
   Subjective:    Patient ID: Andrew Clay, male    DOB: [birth year], 58 y.o.   MRN: [id number]  The patient is a 58 year old male here for evaluation of his left leg.  He has a large wound that has been slow to heal.  It is filling in nicely and today it looks clean.  The periwound area actually looks better than it did last week.  The home health nurse was concerned so she had him come in.  He says it looks better than it even did yesterday.      Review of Systems  Constitutional: Negative.   HENT: Negative.    Eyes: Negative.   Respiratory: Negative.    Cardiovascular: Negative.   Gastrointestinal: Negative.   Musculoskeletal: Negative.        Objective:   Physical Exam Vitals reviewed.  Cardiovascular:     Pulses: Normal pulses.  Pulmonary:     Effort: Pulmonary effort is normal.  Musculoskeletal:       Legs:  Skin:    Capillary Refill: Capillary refill takes less than 2 seconds.  Neurological:     Mental Status: He is alert and oriented to person, place, and time.  Psychiatric:        Mood and Affect: Mood normal.        Behavior: Behavior normal.        Thought Content: Thought content normal.        Judgment: Judgment normal.           Assessment & Plan:     ICD-10-CM   1. Wound of left lower extremity, initial encounter  S81.802A        I was able to put an Urgo dressing on it with ABDs and a Coban.  We are still planning on surgery.  The [facility] can go back on whenever the nurse returns.  Pictures were obtained of the patient and placed in the chart with the patient's or guardian's permission.

## 2024-01-22 ENCOUNTER — Telehealth: Payer: Self-pay | Admitting: *Deleted

## 2024-01-22 ENCOUNTER — Other Ambulatory Visit: Payer: Self-pay | Admitting: Student

## 2024-01-22 DIAGNOSIS — S8012XA Contusion of left lower leg, initial encounter: Secondary | ICD-10-CM

## 2024-01-22 DIAGNOSIS — S81802A Unspecified open wound, left lower leg, initial encounter: Secondary | ICD-10-CM

## 2024-01-22 NOTE — Telephone Encounter (Signed)
 Received on (01/15/2024) via of fax Physician Order from [facility].  Requesting signature,date and return.  Given to provider to complete.  Physician Order signed,dated,and faxed back to [facility].  Confirmation received and copy scanned into the chart.//AR/CMA

## 2024-01-22 NOTE — Telephone Encounter (Signed)
 Received on (01/15/2024) via of fax Physician Order from [facility].  Requesting signature and return.  Given to provider to sign.    Physician Order signed and faxed back to [facility].  Confirmation received and copy scanned into the chart.//AR/CMA

## 2024-01-22 NOTE — Progress Notes (Signed)
 Referral to lymphedema clinic.  Patient was in agreement with trying this for his lower leg swelling.

## 2024-01-23 ENCOUNTER — Telehealth: Payer: Self-pay | Admitting: *Deleted

## 2024-01-23 NOTE — Telephone Encounter (Signed)
 Received on (01/16/2024) via of fax V.A.C. Wound Progress Tracking from [facility].   Given to provider to complete.    V.A.C. Wound Progress Tracking completed and faxed back to [facility].  Confirmation received and copy scanned into the chart.//AR/CMA

## 2024-01-30 ENCOUNTER — Encounter (HOSPITAL_COMMUNITY): Payer: Self-pay | Admitting: Plastic Surgery

## 2024-01-30 NOTE — Progress Notes (Addendum)
 SDW CALL  Patient was given pre-op instructions over the phone. The opportunity was given for the patient to ask questions. No further questions asked. Patient verbalized understanding of instructions given.   PCP - Gasper Nancyann BRAVO, MD  Cardiologist - denies  PPM/ICD - denies Device Orders -  Rep Notified -   Chest x-ray - na EKG - 11/06/23 Stress Test - denies ECHO - denies Cardiac Cath - denies  Sleep Study - denies CPAP - no  Fasting Blood Sugar - na Checks Blood Sugar _____ times a day  Blood Thinner Instructions:na Aspirin Instructions:na  ERAS Protcol -clears until 0800 PRE-SURGERY Ensure or G2- no  COVID TEST- na   Anesthesia review: no  Patient denies shortness of breath, fever, cough and chest pain over the phone call  Oral Hygiene is also important to reduce your risk of infection.  Remember - BRUSH YOUR TEETH THE MORNING OF SURGERY WITH YOUR REGULAR TOOTHPASTE

## 2024-02-04 ENCOUNTER — Encounter (HOSPITAL_COMMUNITY): Payer: Self-pay | Admitting: Plastic Surgery

## 2024-02-04 ENCOUNTER — Encounter (HOSPITAL_COMMUNITY)
Admission: RE | Disposition: A | Discharge status: Home / Self Care | Payer: Self-pay | Source: Home / Self Care | Attending: Plastic Surgery

## 2024-02-04 ENCOUNTER — Encounter (HOSPITAL_COMMUNITY): Admitting: Registered Nurse

## 2024-02-04 ENCOUNTER — Ambulatory Visit (HOSPITAL_BASED_OUTPATIENT_CLINIC_OR_DEPARTMENT_OTHER): Admitting: Registered Nurse

## 2024-02-04 ENCOUNTER — Ambulatory Visit (HOSPITAL_COMMUNITY)
Admission: RE | Admit: 2024-02-04 | Discharge: 2024-02-04 | Disposition: A | Discharge status: Home / Self Care | Attending: Plastic Surgery | Admitting: Plastic Surgery

## 2024-02-04 DIAGNOSIS — S81802A Unspecified open wound, left lower leg, initial encounter: Secondary | ICD-10-CM | POA: Diagnosis not present

## 2024-02-04 DIAGNOSIS — S71102D Unspecified open wound, left thigh, subsequent encounter: Secondary | ICD-10-CM

## 2024-02-04 DIAGNOSIS — X58XXXA Exposure to other specified factors, initial encounter: Secondary | ICD-10-CM | POA: Insufficient documentation

## 2024-02-04 DIAGNOSIS — S81802D Unspecified open wound, left lower leg, subsequent encounter: Secondary | ICD-10-CM | POA: Diagnosis present

## 2024-02-04 HISTORY — PX: SKIN SPLIT GRAFT: SHX444

## 2024-02-04 HISTORY — PX: APPLICATION, SKIN SUBSTITUTE: SHX7530

## 2024-02-04 LAB — CBC
HCT: 37 % — ABNORMAL LOW (ref 39.0–52.0)
Hemoglobin: 11.8 g/dL — ABNORMAL LOW (ref 13.0–17.0)
MCH: 26.9 pg (ref 26.0–34.0)
MCHC: 31.9 g/dL (ref 30.0–36.0)
MCV: 84.5 fL (ref 80.0–100.0)
Platelets: 252 K/uL (ref 150–400)
RBC: 4.38 MIL/uL (ref 4.22–5.81)
RDW: 13.2 % (ref 11.5–15.5)
WBC: 6.2 K/uL (ref 4.0–10.5)
nRBC: 0 % (ref 0.0–0.2)

## 2024-02-04 SURGERY — APPLICATION, GRAFT, SKIN, SPLIT-THICKNESS
Anesthesia: General | Site: Leg Upper | Laterality: Left

## 2024-02-04 MED ORDER — OXYCODONE HCL 5 MG PO TABS
5.0000 mg | ORAL_TABLET | Freq: Once | ORAL | Status: AC | PRN
  Administered 2024-02-04: 5 mg via ORAL

## 2024-02-04 MED ORDER — PHENYLEPHRINE 80 MCG/ML (10ML) SYRINGE FOR IV PUSH (FOR BLOOD PRESSURE SUPPORT)
PREFILLED_SYRINGE | INTRAVENOUS | Status: DC | PRN
  Administered 2024-02-04: 160 ug via INTRAVENOUS
  Administered 2024-02-04: 80 ug via INTRAVENOUS

## 2024-02-04 MED ORDER — ORAL CARE MOUTH RINSE: 15.0000 mL | Freq: Once | OROMUCOSAL | Status: AC

## 2024-02-04 MED ORDER — PROPOFOL 10 MG/ML IV BOLUS
INTRAVENOUS | Status: AC
  Filled 2024-02-04: qty 20

## 2024-02-04 MED ORDER — DEXAMETHASONE SOD PHOSPHATE PF 10 MG/ML IJ SOLN
INTRAMUSCULAR | Status: DC | PRN
  Administered 2024-02-04: 10 mg via INTRAVENOUS

## 2024-02-04 MED ORDER — MINERAL OIL LIGHT OIL
TOPICAL_OIL | Freq: Once | Status: DC
  Filled 2024-02-04: qty 10

## 2024-02-04 MED ORDER — EPHEDRINE SULFATE-NACL 50-0.9 MG/10ML-% IV SOSY
PREFILLED_SYRINGE | INTRAVENOUS | Status: DC | PRN
  Administered 2024-02-04: 15 mg via INTRAVENOUS
  Administered 2024-02-04 (×2): 5 mg via INTRAVENOUS

## 2024-02-04 MED ORDER — VISTASEAL 10 ML SINGLE DOSE KIT
10.0000 mL | PACK | Freq: Once | CUTANEOUS | Status: DC
  Filled 2024-02-04: qty 10

## 2024-02-04 MED ORDER — FENTANYL CITRATE (PF) 100 MCG/2ML IJ SOLN
INTRAMUSCULAR | Status: AC
  Filled 2024-02-04: qty 2

## 2024-02-04 MED ORDER — MIDAZOLAM HCL (PF) 2 MG/2ML IJ SOLN
INTRAMUSCULAR | Status: DC | PRN
  Administered 2024-02-04: 2 mg via INTRAVENOUS

## 2024-02-04 MED ORDER — SODIUM CHLORIDE 0.9% FLUSH: 3.0000 mL | Freq: Two times a day (BID) | INTRAVENOUS | Status: DC

## 2024-02-04 MED ORDER — OXYCODONE HCL 5 MG PO TABS: 5.0000 mg | ORAL_TABLET | ORAL | Status: DC | PRN

## 2024-02-04 MED ORDER — PROPOFOL 10 MG/ML IV BOLUS
INTRAVENOUS | Status: DC | PRN
  Administered 2024-02-04: 200 mg via INTRAVENOUS

## 2024-02-04 MED ORDER — CHLORHEXIDINE GLUCONATE 0.12 % MT SOLN
15.0000 mL | Freq: Once | OROMUCOSAL | Status: AC
  Administered 2024-02-04: 15 mL via OROMUCOSAL
  Filled 2024-02-04: qty 15

## 2024-02-04 MED ORDER — SODIUM CHLORIDE 0.9 % IV SOLN: 250.0000 mL | INTRAVENOUS | Status: DC | PRN

## 2024-02-04 MED ORDER — MINERAL OIL LIGHT OIL
TOPICAL_OIL | Status: DC | PRN
  Administered 2024-02-04: 1 via TOPICAL

## 2024-02-04 MED ORDER — BUPIVACAINE-EPINEPHRINE (PF) 0.25% -1:200000 IJ SOLN
INTRAMUSCULAR | Status: AC
  Filled 2024-02-04: qty 30

## 2024-02-04 MED ORDER — OXYCODONE HCL 5 MG/5ML PO SOLN: 5.0000 mg | Freq: Once | ORAL | Status: AC | PRN

## 2024-02-04 MED ORDER — ACETAMINOPHEN 650 MG RE SUPP: 650.0000 mg | RECTAL | Status: DC | PRN

## 2024-02-04 MED ORDER — CHLORHEXIDINE GLUCONATE CLOTH 2 % EX PADS: 6.0000 | MEDICATED_PAD | Freq: Once | CUTANEOUS | Status: DC

## 2024-02-04 MED ORDER — LACTATED RINGERS IV SOLN: INTRAVENOUS | Status: DC

## 2024-02-04 MED ORDER — ACETAMINOPHEN 10 MG/ML IV SOLN: 1000.0000 mg | Freq: Once | INTRAVENOUS | Status: DC | PRN

## 2024-02-04 MED ORDER — DROPERIDOL 2.5 MG/ML IJ SOLN: 0.6250 mg | Freq: Once | INTRAMUSCULAR | Status: DC | PRN

## 2024-02-04 MED ORDER — FENTANYL CITRATE (PF) 100 MCG/2ML IJ SOLN: 25.0000 ug | INTRAMUSCULAR | Status: DC | PRN

## 2024-02-04 MED ORDER — OXYCODONE HCL 5 MG PO TABS
ORAL_TABLET | ORAL | Status: AC
  Filled 2024-02-04: qty 1

## 2024-02-04 MED ORDER — VISTASEAL 10 ML SINGLE DOSE KIT
PACK | CUTANEOUS | Status: DC | PRN
  Administered 2024-02-04: 10 mL via TOPICAL

## 2024-02-04 MED ORDER — ACETAMINOPHEN 10 MG/ML IV SOLN
INTRAVENOUS | Status: DC | PRN
  Administered 2024-02-04: 1000 mg via INTRAVENOUS

## 2024-02-04 MED ORDER — FENTANYL CITRATE (PF) 250 MCG/5ML IJ SOLN
INTRAMUSCULAR | Status: DC | PRN
  Administered 2024-02-04: 50 ug via INTRAVENOUS

## 2024-02-04 MED ORDER — BUPIVACAINE-EPINEPHRINE 0.25% -1:200000 IJ SOLN
INTRAMUSCULAR | Status: DC | PRN
  Administered 2024-02-04: 30 mL

## 2024-02-04 MED ORDER — ACETAMINOPHEN 325 MG PO TABS: 650.0000 mg | ORAL_TABLET | ORAL | Status: DC | PRN

## 2024-02-04 MED ORDER — SODIUM CHLORIDE 0.9% FLUSH: 3.0000 mL | INTRAVENOUS | Status: DC | PRN

## 2024-02-04 MED ORDER — LIDOCAINE 2% (20 MG/ML) 5 ML SYRINGE
INTRAMUSCULAR | Status: DC | PRN
  Administered 2024-02-04: 100 mg via INTRAVENOUS

## 2024-02-04 MED ORDER — ONDANSETRON HCL 4 MG/2ML IJ SOLN
INTRAMUSCULAR | Status: DC | PRN
  Administered 2024-02-04: 4 mg via INTRAVENOUS

## 2024-02-04 MED ORDER — CEFAZOLIN SODIUM-DEXTROSE 2-4 GM/100ML-% IV SOLN
2.0000 g | INTRAVENOUS | Status: AC
  Administered 2024-02-04: 2 g via INTRAVENOUS

## 2024-02-04 MED ORDER — MIDAZOLAM HCL 2 MG/2ML IJ SOLN
INTRAMUSCULAR | Status: AC
  Filled 2024-02-04: qty 2

## 2024-02-04 SURGICAL SUPPLY — 48 items
BLADE CLIPPER SURG (BLADE) IMPLANT
BLADE DERMATOME SS (BLADE) ×2 IMPLANT
BNDG ELASTIC 4X5.8 VLCR STR LF (GAUZE/BANDAGES/DRESSINGS) IMPLANT
BNDG ELASTIC 6INX 5YD STR LF (GAUZE/BANDAGES/DRESSINGS) IMPLANT
BNDG GAUZE DERMACEA FLUFF 4 (GAUZE/BANDAGES/DRESSINGS) IMPLANT
CANISTER SUCTION 3000ML PPV (SUCTIONS) IMPLANT
CANISTER WOUND CARE 500ML ATS (WOUND CARE) IMPLANT
COTTONBALL LRG STERILE PKG (GAUZE/BANDAGES/DRESSINGS) IMPLANT
COVER SURGICAL LIGHT HANDLE (MISCELLANEOUS) ×2 IMPLANT
DRAPE HALF SHEET 40X57 (DRAPES) ×2 IMPLANT
DRAPE INCISE IOBAN 66X45 STRL (DRAPES) ×2 IMPLANT
DRAPE SURG ORHT 6 SPLT 77X108 (DRAPES) ×4 IMPLANT
DRSG CUTIMED SORBACT 7X9 (GAUZE/BANDAGES/DRESSINGS) IMPLANT
DRSG HYDROCOLLOID 4X4 (GAUZE/BANDAGES/DRESSINGS) IMPLANT
DRSG MEPILEX POST OP 4X12 (GAUZE/BANDAGES/DRESSINGS) IMPLANT
DRSG OPSITE 6X11 MED (GAUZE/BANDAGES/DRESSINGS) IMPLANT
DRSG TELFA 3X8 NADH STRL (GAUZE/BANDAGES/DRESSINGS) ×4 IMPLANT
DRSG VAC GRANUFOAM LG (GAUZE/BANDAGES/DRESSINGS) IMPLANT
DRSG VAC GRANUFOAM MED (GAUZE/BANDAGES/DRESSINGS) IMPLANT
DRSG VAC GRANUFOAM SM (GAUZE/BANDAGES/DRESSINGS) IMPLANT
DRSG VAC PEEL AND PLACE MED (GAUZE/BANDAGES/DRESSINGS) IMPLANT
ELECTRODE REM PT RTRN 9FT ADLT (ELECTROSURGICAL) IMPLANT
FILTER STRAW FLUID ASPIR (MISCELLANEOUS) ×2 IMPLANT
GAUZE PAD ABD 8X10 STRL (GAUZE/BANDAGES/DRESSINGS) ×2 IMPLANT
GAUZE SPONGE 4X4 12PLY STRL (GAUZE/BANDAGES/DRESSINGS) ×2 IMPLANT
GAUZE XEROFORM 5X9 LF (GAUZE/BANDAGES/DRESSINGS) ×2 IMPLANT
GEL ULTRASOUND 20GR AQUASONIC (MISCELLANEOUS) IMPLANT
GLOVE BIO SURGEON STRL SZ 6.5 (GLOVE) ×4 IMPLANT
GOWN STRL REUS W/ TWL LRG LVL3 (GOWN DISPOSABLE) ×4 IMPLANT
GRAFT DERMACARRIERS 1 TO 1.5 (DISPOSABLE) ×2 IMPLANT
GRAFT MATRIX 2 LAYER 5X5 (Graft) IMPLANT
KIT BASIN OR (CUSTOM PROCEDURE TRAY) ×2 IMPLANT
KIT DRSG PREVENA PLUS 7DAY 125 (MISCELLANEOUS) IMPLANT
KIT TURNOVER KIT B (KITS) ×2 IMPLANT
NEEDLE HYPO 25GX1X1/2 BEV (NEEDLE) ×2 IMPLANT
PACK GENERAL/GYN (CUSTOM PROCEDURE TRAY) ×2 IMPLANT
PAD ARMBOARD POSITIONER FOAM (MISCELLANEOUS) ×4 IMPLANT
SET HNDPC FAN SPRY TIP SCT (DISPOSABLE) IMPLANT
SOLN 0.9% NACL POUR BTL 1000ML (IV SOLUTION) ×2 IMPLANT
STAPLER SKIN PROX 35W (STAPLE) ×2 IMPLANT
SURGILUBE 2OZ TUBE FLIPTOP (MISCELLANEOUS) IMPLANT
SUT MNCRL AB 4-0 PS2 18 (SUTURE) IMPLANT
SUT SILK 4 0 PS 2 (SUTURE) IMPLANT
SUT VIC AB 4-0 PS2 18 (SUTURE) IMPLANT
SYR CONTROL 10ML LL (SYRINGE) ×2 IMPLANT
TOWEL GREEN STERILE (TOWEL DISPOSABLE) ×2 IMPLANT
TOWEL GREEN STERILE FF (TOWEL DISPOSABLE) ×2 IMPLANT
UNDERPAD 30X36 HEAVY ABSORB (UNDERPADS AND DIAPERS) ×2 IMPLANT

## 2024-02-04 NOTE — Discharge Instructions (Addendum)
 INSTRUCTIONS FOR AFTER SURGERY   You will likely have some questions about what to expect following your operation.  The following information will help you and your family understand what to expect when you are discharged from the hospital.  It is important to follow these guidelines to help ensure a smooth recovery and reduce complication.  Postoperative instructions include information on: diet, wound care, medications and physical activity.  AFTER SURGERY Expect to go home after the procedure.  In some cases, you may need to spend one night in the hospital for observation.  DIET Surgery does not require a specific diet.  However, the healthier you eat the better your body will heal. It is important to increasing your protein intake.  This means limiting the foods with sugar and carbohydrates.  Focus on vegetables and some meat.  If you have liposuction during your procedure be sure to drink water.  If your urine is bright yellow, then it is concentrated, and you need to drink more water.  As a general rule after surgery, you should have 8 ounces of water every hour while awake.  If you find you are persistently nauseated or unable to take in liquids let us  know.  NO TOBACCO USE or EXPOSURE.  This will slow your healing process and lead to a wound.  WOUND CARE For the left thigh:  DO NOT REMOVE dressing.   No baths, pools or hot tubs for four weeks. We close your incision to leave the smallest and best-looking scar. No ointment or creams on your incisions for four weeks.  No Neosporin (Too many skin reactions).  A few weeks after surgery you can use Mederma and start massaging the scar. NO Ice or heating pads to the operative site. Right thigh:  KY gel daily and cover with gauze.   ACTIVITY No heavy lifting until cleared by the doctor.  This usually means no more than a half-gallon of milk.  It is OK to walk and climb stairs. Moving your legs is very important to decrease your risk of a blood  clot.  It will also help keep you from getting deconditioned.  Every 1 to 2 hours get up and walk for 5 minutes. This will help with a quicker recovery back to normal.  Let pain be your guide so you don't do too much.  This time is for you to recover.    WORK Everyone returns to work at different times. As a rough guide, most people take at least 1 - 2 weeks off prior to returning to work. If you need documentation for your job, give the forms to the front staff at the clinic.  DRIVING Arrange for someone to bring you home from the hospital after your surgery.  You may be able to drive a few days after surgery but not while taking any narcotics or valium.  BOWEL MOVEMENTS Constipation can occur after anesthesia and while taking pain medication.  It is important to stay ahead for your comfort.  We recommend taking Milk of Magnesia (2 tablespoons; twice a day) while taking the pain pills.  MEDICATIONS You may be prescribed should start after surgery At your preoperative visit for you history and physical you were given the following medications: Antibiotic: Start this medication when you get home and take according to the instructions on the bottle. Zofran  4 mg:  This is to treat nausea and vomiting.  You can take this every 6 hours as needed and only if needed. Oxycodone   5 mg every 6 hours for 3 - 5 days.  This is to be used after you have taken the Motrin  or the Tylenol .  Over the counter Medication to take: Ibuprofen  (Motrin ) 400 - 600 mg every 6 hour for 7 days Tylenol  500 mg every 6 hours for 7 days.  Only take the Oxycodone  after you have tried these two. MiraLAX  or stool softener of choice: Take this according to the bottle if you take the Norco.  WHEN TO CALL Call your surgeon's office if any of the following occur: Fever 101 degrees F or greater Excessive bleeding or fluid from the incision site. Pain that increases over time without aid from the medications Redness, warmth, or pus  draining from incision sites Persistent nausea or inability to take in liquids Severe misshapen area that underwent the operation.

## 2024-02-04 NOTE — Anesthesia Preprocedure Evaluation (Signed)
"                                    Anesthesia Evaluation  Patient identified by MRN, date of birth, ID band Patient awake    Reviewed: Allergy & Precautions, NPO status , Patient's Chart, lab work & pertinent test results  History of Anesthesia Complications Negative for: history of anesthetic complications  Airway Mallampati: I  TM Distance: >3 FB Neck ROM: Full    Dental  (+) Teeth Intact, Dental Advisory Given, Chipped,    Pulmonary neg pulmonary ROS   Pulmonary exam normal breath sounds clear to auscultation       Cardiovascular (-) angina negative cardio ROS Normal cardiovascular exam Rhythm:Regular Rate:Normal     Neuro/Psych neg Seizures  Anxiety     negative neurological ROS     GI/Hepatic negative GI ROS, Neg liver ROS,,,  Endo/Other  negative endocrine ROS    Renal/GU negative Renal ROS  negative genitourinary   Musculoskeletal Wound of left thigh   Abdominal   Peds  Hematology  (+) Blood dyscrasia, anemia   Anesthesia Other Findings   Reproductive/Obstetrics                              Anesthesia Physical Anesthesia Plan  ASA: 2  Anesthesia Plan: General   Post-op Pain Management:    Induction: Intravenous  PONV Risk Score and Plan: 3 and Ondansetron , Dexamethasone  and Midazolam   Airway Management Planned: LMA  Additional Equipment: None  Intra-op Plan:   Post-operative Plan: Extubation in OR  Informed Consent: I have reviewed the patients History and Physical, chart, labs and discussed the procedure including the risks, benefits and alternatives for the proposed anesthesia with the patient or authorized representative who has indicated his/her understanding and acceptance.     Dental advisory given  Plan Discussed with: CRNA  Anesthesia Plan Comments:          Anesthesia Quick Evaluation  "

## 2024-02-04 NOTE — Transfer of Care (Signed)
 Immediate Anesthesia Transfer of Care Note  Patient: Andrew Clay  Procedure(s) Performed: APPLICATION, GRAFT, SKIN, SPLIT-THICKNESS (Left: Leg Upper) APPLICATION, SKIN SUBSTITUTE (Left: Leg Lower)  Patient Location: PACU  Anesthesia Type:General  Level of Consciousness: awake, alert , and oriented  Airway & Oxygen Therapy: Patient Spontanous Breathing  Post-op Assessment: Report given to RN, Post -op Vital signs reviewed and stable, and Patient moving all extremities  Post vital signs: Reviewed and stable  Last Vitals:  Vitals Value Taken Time  BP 122/69 02/04/24 12:06  Temp    Pulse 73 02/04/24 12:07  Resp 18 02/04/24 12:07  SpO2 96 % 02/04/24 12:07  Vitals shown include unfiled device data.  Last Pain:  Vitals:   02/04/24 0917  TempSrc:   PainSc: 0-No pain         Complications: No notable events documented.

## 2024-02-04 NOTE — Anesthesia Procedure Notes (Signed)
 Procedure Name: LMA Insertion Date/Time: 02/04/2024 11:08 AM  Performed by: Jerl Donald LABOR, CRNAPre-anesthesia Checklist: Patient identified, Emergency Drugs available, Suction available and Patient being monitored Patient Re-evaluated:Patient Re-evaluated prior to induction Oxygen Delivery Method: Circle System Utilized Preoxygenation: Pre-oxygenation with 100% oxygen Induction Type: IV induction Ventilation: Mask ventilation without difficulty LMA: LMA inserted LMA Size: 4.0 Tube type: Oral Number of attempts: 1 Placement Confirmation: positive ETCO2 and breath sounds checked- equal and bilateral Tube secured with: Tape Dental Injury: Teeth and Oropharynx as per pre-operative assessment

## 2024-02-04 NOTE — Anesthesia Postprocedure Evaluation (Signed)
"   Anesthesia Post Note  Patient: Krystal RAMAN Hammes  Procedure(s) Performed: APPLICATION, GRAFT, SKIN, SPLIT-THICKNESS (Left: Leg Upper) APPLICATION, SKIN SUBSTITUTE (Left: Leg Lower)     Patient location during evaluation: PACU Anesthesia Type: General Level of consciousness: awake and alert Pain management: pain level controlled Vital Signs Assessment: post-procedure vital signs reviewed and stable Respiratory status: spontaneous breathing, nonlabored ventilation, respiratory function stable and patient connected to nasal cannula oxygen Cardiovascular status: blood pressure returned to baseline and stable Postop Assessment: no apparent nausea or vomiting Anesthetic complications: no   No notable events documented.  Last Vitals:  Vitals:   02/04/24 1230 02/04/24 1245  BP: (!) 145/89 (!) 144/87  Pulse: 72 70  Resp: 18 10  Temp: (!) 36.4 C   SpO2: 99% 99%    Last Pain:  Vitals:   02/04/24 1245  TempSrc:   PainSc: 3                  Thom JONELLE Peoples      "
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
## 2024-02-04 NOTE — Interval H&P Note (Signed)
 History and Physical Interval Note:  02/04/2024 9:27 AM  Andrew Clay  has presented today for surgery, with the diagnosis of left leg wound.  The various methods of treatment have been discussed with the patient and family. After consideration of risks, benefits and other options for treatment, the patient has consented to  Procedures with comments: APPLICATION, GRAFT, SKIN, SPLIT-THICKNESS (Left) - Left leg wound split thickness skin graft with myriad placement APPLICATION, SKIN SUBSTITUTE (Left) as a surgical intervention.  The patient's history has been reviewed, patient examined, no change in status, stable for surgery.  I have reviewed the patient's chart and labs.  Questions were answered to the patient's satisfaction.     Andrew Clay [name]

## 2024-02-05 ENCOUNTER — Encounter (HOSPITAL_COMMUNITY): Payer: Self-pay | Admitting: Plastic Surgery

## 2024-02-12 ENCOUNTER — Ambulatory Visit (INDEPENDENT_AMBULATORY_CARE_PROVIDER_SITE_OTHER): Payer: Self-pay | Admitting: Plastic Surgery

## 2024-02-12 DIAGNOSIS — S81802A Unspecified open wound, left lower leg, initial encounter: Secondary | ICD-10-CM

## 2024-02-12 NOTE — Progress Notes (Signed)
 The patient is a 60 year old male here for follow-up after undergoing a split-thickness skin graft to his left thigh.  The donor site is on the right.  Both sites are looking good.  He does still have a fair bit of drainage from the left side.  And I think this is part of the original injury.  But nothing looks infected.  He does have very sensitive skin so it gets irritated with the tape easily.  Will plan to get pictures at his next visit and he should continue with Xeroform to the left leg and K wire to the right leg.

## 2024-02-21 ENCOUNTER — Telehealth: Payer: Self-pay | Admitting: *Deleted

## 2024-02-21 NOTE — Telephone Encounter (Signed)
 Received by way of fax [facility] Wound Progress Tracking for wound assessments for re-authorization of [facility]  Given to provider to complete.  [facility] Wound Progress Tracking for wound assessment completed and faxed back to [employer] with recent office notes.  Confirmation received and copy scanned into the chart.//AR/CMA

## 2024-02-27 ENCOUNTER — Encounter: Admitting: Student
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
## 2024-03-07 ENCOUNTER — Encounter: Admitting: Student

## 2024-03-12 ENCOUNTER — Encounter: Admitting: Student

## 2024-03-19 ENCOUNTER — Encounter: Admitting: Student
